# Patient Record
Sex: Male | Born: 1985 | Race: White | Hispanic: No | Marital: Married | Smoking: Former smoker
Health system: Southern US, Community
[De-identification: ages and names within clinical notes are randomized; demographics above are authoritative.]

## PROBLEM LIST (undated history)

## (undated) ENCOUNTER — Ambulatory Visit: Admission: EM | Payer: BC Managed Care – PPO

## (undated) DIAGNOSIS — K589 Irritable bowel syndrome without diarrhea: Secondary | ICD-10-CM

## (undated) DIAGNOSIS — F909 Attention-deficit hyperactivity disorder, unspecified type: Secondary | ICD-10-CM

## (undated) DIAGNOSIS — G8929 Other chronic pain: Secondary | ICD-10-CM

## (undated) HISTORY — PX: APPENDECTOMY: SHX54

---

## 2016-03-13 ENCOUNTER — Encounter: Payer: Self-pay | Admitting: *Deleted

## 2016-03-13 ENCOUNTER — Ambulatory Visit (HOSPITAL_COMMUNITY)
Admit: 2016-03-13 | Discharge: 2016-03-13 | Disposition: A | Discharge status: Home / Self Care | Payer: BLUE CROSS/BLUE SHIELD | Attending: Family Medicine | Admitting: Family Medicine

## 2016-03-13 ENCOUNTER — Ambulatory Visit
Admission: EM | Admit: 2016-03-13 | Discharge: 2016-03-13 | Disposition: A | Discharge status: Home / Self Care | Payer: BLUE CROSS/BLUE SHIELD | Attending: Family Medicine | Admitting: Family Medicine

## 2016-03-13 ENCOUNTER — Ambulatory Visit (INDEPENDENT_AMBULATORY_CARE_PROVIDER_SITE_OTHER)
Admit: 2016-03-13 | Discharge: 2016-03-13 | Disposition: A | Discharge status: Home / Self Care | Payer: BLUE CROSS/BLUE SHIELD | Attending: Family Medicine | Admitting: Family Medicine

## 2016-03-13 DIAGNOSIS — W19XXXA Unspecified fall, initial encounter: Secondary | ICD-10-CM | POA: Diagnosis not present

## 2016-03-13 DIAGNOSIS — R55 Syncope and collapse: Secondary | ICD-10-CM | POA: Diagnosis not present

## 2016-03-13 DIAGNOSIS — S0181XA Laceration without foreign body of other part of head, initial encounter: Secondary | ICD-10-CM | POA: Diagnosis not present

## 2016-03-13 DIAGNOSIS — R42 Dizziness and giddiness: Secondary | ICD-10-CM

## 2016-03-13 DIAGNOSIS — Z23 Encounter for immunization: Secondary | ICD-10-CM | POA: Diagnosis not present

## 2016-03-13 MED ORDER — AMOXICILLIN-POT CLAVULANATE 875-125 MG PO TABS: 1.0000 | ORAL_TABLET | Freq: Two times a day (BID) | ORAL | 0 refills | Status: DC

## 2016-03-13 MED ORDER — TETANUS-DIPHTH-ACELL PERTUSSIS 5-2.5-18.5 LF-MCG/0.5 IM SUSP
0.5000 mL | Freq: Once | INTRAMUSCULAR | Status: AC
  Administered 2016-03-13: 0.5 mL via INTRAMUSCULAR

## 2016-03-13 NOTE — ED Triage Notes (Signed)
Pt states he had received stressful news from work and after hanging up the phone went to have a bowel movement and became dizzy and fell face first striking jaw on unknown object. Has 1/2" through and through laceration just below lower lip and a superficial laceration to anterior scalp. States he remains dizzy presently.

## 2016-03-13 NOTE — ED Provider Notes (Signed)
[facility]    CSN: [id number] Arrival date & time: 03/13/16  1555  First Provider Contact:  First MD Initiated Contact with Patient 03/13/16 1653        History   Chief Complaint Chief Complaint  Patient presents with  . Near Syncope  . Dizziness  . Laceration    HPI Trevor Dickson is a 30 y.o. male.   Patient states he was receiving some his work that was somewhat challenging when he went upstairs to go to use a bathroom. He states he climbs steps to go to the bathroom started feeling lightheaded dizzy. By the time he got into the bathroom he was having trouble on fasting his pants the dizziness continued and got worse and he basically had a near-syncopal episode. He never lost consciousness they did fall and he fell he hit his head on one thing is generally another thing. He states he still doesn't feel that great right now. He had a syncopal episode about 12 years ago when he was menstruating friend about unrequited love and he went to have a bowel movement and passed out then. He has a history of IBS no history of seizure disorder.  Other than IBS no pertinent medical history. He has a history of smoking no known drug allergies and no pertinent family medical history pertaining to this visit   According to his wife he hit his head on one object he then also bit through his lower gum with his upper teeth and he has on puncture wound through the inner mouth extending to the outside skin   The history is provided by the patient and the spouse. No language interpreter was used.  Near Syncope  This is a new problem. The current episode started 1 to 2 hours ago. The problem has been gradually improving. Associated symptoms include headaches. Pertinent negatives include no chest pain, no abdominal pain and no shortness of breath. Nothing aggravates the symptoms. Nothing relieves the symptoms. He has tried nothing for the symptoms.  Dizziness  Associated symptoms: headaches     Associated symptoms: no chest pain and no shortness of breath   Laceration  Location:  Head/neck and mouth Head/neck laceration location:  Scalp Mouth laceration location:  Lower outer lip Depth:  Cutaneous Pain details:    Severity:  Moderate   Timing:  Constant   Progression:  Unchanged Foreign body present:  No foreign bodies Relieved by:  Nothing Associated symptoms: no fever, no focal weakness, no numbness, no rash, no redness, no swelling and no streaking     History reviewed. No pertinent past medical history.  There are no active problems to display for this patient.   Past Surgical History:  Procedure Laterality Date  . APPENDECTOMY         Home Medications    Prior to Admission medications   Medication Sig Start Date End Date Taking? Authorizing Provider  cetirizine (ZYRTEC) 10 MG tablet Take 10 mg by mouth daily.   Yes Historical Provider, MD  amoxicillin-clavulanate (AUGMENTIN) 875-125 MG tablet Take 1 tablet by mouth 2 (two) times daily. 03/13/16   Hassan Rowan, MD    Family History History reviewed. No pertinent family history.  Social History Social History  Substance Use Topics  . Smoking status: Current Every Day Smoker  . Smokeless tobacco: Never Used  . Alcohol use Yes     Allergies   Review of patient's allergies indicates no known allergies.   Review of Systems Review of  Systems  Constitutional: Negative for fever.  Respiratory: Negative for shortness of breath.   Cardiovascular: Positive for near-syncope. Negative for chest pain.  Gastrointestinal: Negative for abdominal pain.  Skin: Negative for rash.  Neurological: Positive for dizziness and headaches. Negative for focal weakness.  All other systems reviewed and are negative.    Physical Exam Triage Vital Signs ED Triage Vitals [03/13/16 1618]  Enc Vitals Group     BP 127/72     Pulse Rate 72     Resp 16     Temp 98 F (36.7 C)     Temp Source Oral     SpO2 97 %      Weight 165 lb (74.8 kg)     Height 5\' 7"  (1.702 m)     Head Circumference      Peak Flow      Pain Score      Pain Loc      Pain Edu?      Excl. in GC?    No data found.   Updated Vital Signs BP 127/72 (BP Location: Left Arm)   Pulse 72   Temp 98 F (36.7 C) (Oral)   Resp 16   Ht 5\' 7"  (1.702 m)   Wt 165 lb (74.8 kg)   SpO2 97%   BMI 25.84 kg/m   Visual Acuity Right Eye Distance:   Left Eye Distance:   Bilateral Distance:    Right Eye Near:   Left Eye Near:    Bilateral Near:     Physical Exam  Constitutional: He is oriented to person, place, and time. He appears well-developed and well-nourished.  HENT:  Head: Normocephalic. Head is with laceration.    Right Ear: External ear normal.  Left Ear: External ear normal.  Nose: Nose normal.  Mouth/Throat: Oropharynx is clear and moist.  She has 2 laceration a 2 semi-laceration on the upper scalp that goes across skin lines and then another laceration going through the dermis on his right lower lip almost midline  He has a puncture wound in the inner mouth the goes out into the dermis that caused the lower lip laceration  Eyes: Pupils are equal, round, and reactive to light.  Neck: Normal range of motion.  Cardiovascular: Normal rate and regular rhythm.   Pulmonary/Chest: Effort normal and breath sounds normal.  Abdominal: Soft.  Musculoskeletal: Normal range of motion. He exhibits no edema.  Neurological: He is alert and oriented to person, place, and time. He has normal reflexes.  Skin: Skin is warm.  Psychiatric: He has a normal mood and affect.  Vitals reviewed.    UC Treatments / Results  Labs (all labs ordered are listed, but only abnormal results are displayed) Labs Reviewed - No data to display  EKG  EKG Interpretation None     ED ECG REPORT I, Emran Molzahn H, the attending physician, personally viewed and interpreted this ECG.   Date: 03/13/2016  EKG Time 17:26:51   Rhythm: normal EKG,  normal sinus rhythm  Axis: 51  Intervals:none  ST&T Change: none  Radiology Ct Head Wo Contrast  Result Date: 03/13/2016 CLINICAL DATA:  Pain after trauma. EXAM: CT HEAD WITHOUT CONTRAST TECHNIQUE: Contiguous axial images were obtained from the base of the skull through the vertex without intravenous contrast. COMPARISON:  None. FINDINGS: Brain: No evidence of acute infarction, hemorrhage, extra-axial collection, ventriculomegaly, or mass effect. Vascular: No hyperdense vessel or unexpected calcification. Skull: Negative for fracture or focal lesion. Sinuses/Orbits: No acute  findings. Other: None. IMPRESSION: Normal Electronically Signed   By: Gerome Samavid  Williams III M.D   On: 03/13/2016 18:15    Procedures .Marland Kitchen.Laceration Repair Date/Time: 03/13/2016 5:46 PM Performed by: Hassan RowanWADE, Lakiah Dhingra Authorized by: Hassan RowanWADE, Marieme Mcmackin   Consent:    Consent obtained:  Verbal   Consent given by:  Parent   Risks discussed:  Infection Laceration details:    Location:  Scalp   Scalp location:  Frontal   Length (cm):  3 Repair type:    Repair type:  Simple Exploration:    Contaminated: no   Treatment:    Area cleansed with:  Shur-Clens   Visualized foreign bodies/material removed: no   Skin repair:    Repair method:  Tissue adhesive Approximation:    Approximation:  Close   Vermilion border: well-aligned   Post-procedure details:    Dressing:  Open (no dressing)   Patient tolerance of procedure:  Tolerated well, no immediate complications Comments:     Both areas removed patient tolerated going well.    (including critical care time)  Medications Ordered in UC Medications  Tdap (BOOSTRIX) injection 0.5 mL (0.5 mLs Intramuscular Given 03/13/16 1634)     Initial Impression / Assessment and Plan / UC Course  I have reviewed the triage vital signs and the nursing notes.  Pertinent labs & imaging results that were available during my care of the patient were reviewed by me and considered in my medical  decision making (see chart for details).  Clinical Course    Patient with no sequela episode head contusion and to lacerations. Examination was otherwise unremarkable other than the laceration. Using Dermabond visitors repair. Patient given a tetanus status out of date. EKG showed no signs of arrhythmia. And CT scan of the head was ordered and best normal will allow him to go home we'll place him on Augmentin since he has a wound to his outer lip involving his in her mouth  Final Clinical Impressions(s) / UC Diagnoses   Final diagnoses:  Near syncope  Facial laceration, initial encounter  Dizziness    New Prescriptions New Prescriptions   AMOXICILLIN-CLAVULANATE (AUGMENTIN) 875-125 MG TABLET    Take 1 tablet by mouth 2 (two) times daily.     Hassan RowanEugene Coren Sagan, MD 03/13/16 [phone]

## 2021-08-10 ENCOUNTER — Other Ambulatory Visit: Payer: Self-pay

## 2021-08-10 ENCOUNTER — Ambulatory Visit (INDEPENDENT_AMBULATORY_CARE_PROVIDER_SITE_OTHER): Payer: No Typology Code available for payment source

## 2021-08-10 ENCOUNTER — Ambulatory Visit
Admission: EM | Admit: 2021-08-10 | Discharge: 2021-08-10 | Disposition: A | Discharge status: Home / Self Care | Payer: No Typology Code available for payment source | Attending: Internal Medicine | Admitting: Internal Medicine

## 2021-08-10 DIAGNOSIS — R0602 Shortness of breath: Secondary | ICD-10-CM | POA: Diagnosis not present

## 2021-08-10 DIAGNOSIS — R059 Cough, unspecified: Secondary | ICD-10-CM | POA: Diagnosis not present

## 2021-08-10 DIAGNOSIS — J069 Acute upper respiratory infection, unspecified: Secondary | ICD-10-CM | POA: Diagnosis present

## 2021-08-10 DIAGNOSIS — Z20822 Contact with and (suspected) exposure to covid-19: Secondary | ICD-10-CM | POA: Insufficient documentation

## 2021-08-10 DIAGNOSIS — S29012A Strain of muscle and tendon of back wall of thorax, initial encounter: Secondary | ICD-10-CM | POA: Diagnosis present

## 2021-08-10 LAB — GROUP A STREP BY PCR: Group A Strep by PCR: NOT DETECTED

## 2021-08-10 MED ORDER — BENZONATATE 200 MG PO CAPS: 200.0000 mg | ORAL_CAPSULE | Freq: Three times a day (TID) | ORAL | 0 refills | Status: DC | PRN

## 2021-08-10 MED ORDER — METAXALONE 800 MG PO TABS: 800.0000 mg | ORAL_TABLET | Freq: Three times a day (TID) | ORAL | 0 refills | Status: DC

## 2021-08-10 MED ORDER — ACETAMINOPHEN 500 MG PO TABS
1000.0000 mg | ORAL_TABLET | Freq: Four times a day (QID) | ORAL | Status: DC | PRN
  Administered 2021-08-10: 1000 mg via ORAL

## 2021-08-10 NOTE — ED Triage Notes (Addendum)
Patient presents to Urgent Care with complaints of SOB, cough, rash, and back pain since Monday. Treating symptoms with mucinex, naproxen, and albuterol. Pt states he has a telehealth appt yesterday was prescribed cough med.   Denies fever.

## 2021-08-10 NOTE — ED Provider Notes (Signed)
[facility]    CSN: [id number] Arrival date & time: 08/10/21  0912      History   Chief Complaint Chief Complaint  Patient presents with   Shortness of Breath   Cough   Rash   Back Pain    HPI Trevor Dickson is a 36 y.o. male who presents with URI symptoms and low back pain and lower leg pain x 4 days. Has been fatigued, with decreased appetite. Has chronic back pain, but feels worse since illness and coughing, and cant take deep breaths since provokes thoracic back pain. Denies trouble breathing of feeling SOB, like he cant get air in his lung. Though his back hurts he has continued doing his PT exercises which help his back pain. Is out of Skelaxin which has helped in the past.     History reviewed. No pertinent past medical history.  There are no problems to display for this patient.   Past Surgical History:  Procedure Laterality Date   APPENDECTOMY         Home Medications    Prior to Admission medications   Medication Sig Start Date End Date Taking? Authorizing Provider  albuterol (VENTOLIN HFA) 108 (90 Base) MCG/ACT inhaler Inhale into the lungs. 12/12/19 02/27/22 Yes [provider]  benzonatate (TESSALON) 200 MG capsule Take 1 capsule (200 mg total) by mouth 3 (three) times daily as needed for cough. 08/10/21  Yes Rodriguez-Southworth, Nettie Elm, PA-C  metaxalone (SKELAXIN) 800 MG tablet Take 1 tablet (800 mg total) by mouth 3 (three) times daily. 08/10/21  Yes Rodriguez-Southworth, Nettie Elm, PA-C  Azelastine HCl 137 MCG/SPRAY SOLN Place 1 spray into both nostrils 2 (two) times daily. 06/15/21   [provider]  cetirizine (ZYRTEC) 10 MG tablet Take 10 mg by mouth daily.    [provider]  Multiple Vitamins-Minerals (GNP ONE DAILY MENS 50+ADVANCED) TABS Take 1 tablet by mouth daily.    [provider]    Family History History reviewed. No pertinent family history.  Social History Social History   Tobacco Use   Smoking  status: Every Day   Smokeless tobacco: Never  Substance Use Topics   Alcohol use: Yes     Allergies   Ibuprofen   Review of Systems Review of Systems  Constitutional:  Positive for appetite change, chills, diaphoresis and fatigue. Negative for activity change.  HENT:  Positive for congestion, postnasal drip and sore throat. Negative for ear discharge, ear pain and trouble swallowing.   Eyes:  Negative for discharge.  Respiratory:  Positive for cough. Negative for chest tightness, shortness of breath and wheezing.   Musculoskeletal:  Positive for myalgias. Negative for gait problem.  Skin:  Negative for rash.  Neurological:  Positive for headaches.  Hematological:  Negative for adenopathy.    Physical Exam Triage Vital Signs ED Triage Vitals  Enc Vitals Group     BP 08/10/21 0952 (!) 159/119     Pulse Rate 08/10/21 0952 86     Resp 08/10/21 0952 16     Temp 08/10/21 0952 (!) 97.5 F (36.4 C)     Temp Source 08/10/21 0952 Temporal     SpO2 08/10/21 0952 98 %     Weight --      Height --      Head Circumference --      Peak Flow --      Pain Score 08/10/21 0950 4     Pain Loc --      Pain Edu? --  Excl. in GC? --    No data found.  Updated Vital Signs BP (!) 159/119 (BP Location: Left Arm)    Pulse 86    Temp (!) 97.5 F (36.4 C) (Temporal)    Resp 16    SpO2 98%   Visual Acuity Right Eye Distance:   Left Eye Distance:   Bilateral Distance:    Right Eye Near:   Left Eye Near:    Bilateral Near:     Physical Exam Physical Exam Vitals signs and nursing note reviewed.  Constitutional:      General: he is not in acute distress.    Appearance: Normal appearance. He is not ill-appearing, toxic-appearing or diaphoretic.  HENT:     Head: Normocephalic.     Right Ear: Tympanic membrane, ear canal and external ear normal.     Left Ear: Tympanic membrane, ear canal and external ear normal.     Nose: Nose normal.     Mouth/Throat: mild erythema    Mouth:  Mucous membranes are moist.  Eyes:     General: No scleral icterus.       Right eye: No discharge.        Left eye: No discharge.     Conjunctiva/sclera: Conjunctivae normal.  Neck:     Musculoskeletal: Neck supple. No neck rigidity.  Cardiovascular:     Rate and Rhythm: Normal rate and regular rhythm.     Heart sounds: No murmur.  Pulmonary:     Effort: Pulmonary effort is normal.     Breath sounds: Normal breath sounds.  Musculoskeletal: Normal range of motion. Has tenderness on mid thoracic region and lower lumbar region with palpation, and his muscles feel tight. SLR is neg.  Lymphadenopathy:     Cervical: No cervical adenopathy.  Skin:    General: Skin is warm and dry.     Coloration: Skin is not jaundiced.     Findings: No rash.  Neurological:     Mental Status: He is alert and oriented to person, place, and time.     Gait: Gait normal. LE strength 5/5 bilaterally.  Psychiatric:        Mood and Affect: Mood normal.        Behavior: Behavior normal.        Thought Content: Thought content normal.        Judgment: Judgment normal.    UC Treatments / Results  Labs (all labs ordered are listed, but only abnormal results are displayed) Labs Reviewed  GROUP A STREP BY PCR  SARS CORONAVIRUS 2 (TAT 6-24 HRS)    EKG   Radiology DG Chest 2 View  Result Date: 08/10/2021 CLINICAL DATA:  Cough and shortness of breath. EXAM: CHEST - 2 VIEW COMPARISON:  None. FINDINGS: The lungs are clear without focal pneumonia, edema, pneumothorax or pleural effusion. The cardiopericardial silhouette is within normal limits for size. The visualized bony structures of the thorax show no acute abnormality. IMPRESSION: No active cardiopulmonary disease. Electronically Signed   By: Kennith Center M.D.   On: 08/10/2021 10:34    Procedures Procedures (including critical care time)  Medications Ordered in UC Medications  acetaminophen (TYLENOL) tablet 1,000 mg (has no administration in time range)     Initial Impression / Assessment and Plan / UC Course  I have reviewed the triage vital signs and the nursing notes. Pertinent labs & imaging results that were available during my care of the patient were reviewed by me and considered in my  medical decision making (see chart for details).  Final Clinical Impressions(s) / UC Diagnoses   Final diagnoses:  Suspected COVID-19 virus infection  Upper respiratory tract infection, unspecified type  Muscle strain of upper back   Discharge Instructions   None    ED Prescriptions     Medication Sig Dispense Auth. Provider   metaxalone (SKELAXIN) 800 MG tablet Take 1 tablet (800 mg total) by mouth 3 (three) times daily. 30 tablet Rodriguez-Southworth, Zakry Caso, PA-C   benzonatate (TESSALON) 200 MG capsule Take 1 capsule (200 mg total) by mouth 3 (three) times daily as needed for cough. 30 capsule Rodriguez-Southworth, Nettie ElmSylvia, PA-C      PDMP not reviewed this encounter.   Garey HamRodriguez-Southworth, Lismary Kiehn, PA-C 08/10/21 1113

## 2021-08-11 LAB — SARS CORONAVIRUS 2 (TAT 6-24 HRS): SARS Coronavirus 2: NEGATIVE

## 2021-10-14 ENCOUNTER — Other Ambulatory Visit: Payer: Self-pay

## 2021-10-14 ENCOUNTER — Ambulatory Visit
Admission: EM | Admit: 2021-10-14 | Discharge: 2021-10-14 | Disposition: A | Discharge status: Home / Self Care | Payer: No Typology Code available for payment source | Attending: Student | Admitting: Student

## 2021-10-14 DIAGNOSIS — J209 Acute bronchitis, unspecified: Secondary | ICD-10-CM | POA: Diagnosis not present

## 2021-10-14 DIAGNOSIS — Z87891 Personal history of nicotine dependence: Secondary | ICD-10-CM

## 2021-10-14 DIAGNOSIS — M62830 Muscle spasm of back: Secondary | ICD-10-CM | POA: Diagnosis not present

## 2021-10-14 MED ORDER — AMOXICILLIN-POT CLAVULANATE 875-125 MG PO TABS: 1.0000 | ORAL_TABLET | Freq: Two times a day (BID) | ORAL | 0 refills | Status: DC

## 2021-10-14 MED ORDER — TIZANIDINE HCL 4 MG PO TABS: 4.0000 mg | ORAL_TABLET | Freq: Four times a day (QID) | ORAL | 0 refills | Status: DC | PRN

## 2021-10-14 MED ORDER — PREDNISONE 10 MG (21) PO TBPK: ORAL_TABLET | Freq: Every day | ORAL | 0 refills | Status: DC

## 2021-10-14 MED ORDER — ALBUTEROL SULFATE HFA 108 (90 BASE) MCG/ACT IN AERS
1.0000 | INHALATION_SPRAY | Freq: Four times a day (QID) | RESPIRATORY_TRACT | 0 refills | Status: DC | PRN
Start: 2021-10-14 — End: 2022-06-20

## 2021-10-14 NOTE — ED Provider Notes (Addendum)
[facility]    CSN: [id number] Arrival date & time: 10/14/21  1031      History   Chief Complaint Chief Complaint  Patient presents with   Cough    HPI Trevor Dickson is a 36 y.o. male presenting with cough x6 days. History cigarette smoking but not current smoker.  Describes hacking cough productive of green and yellow sputum, some dyspnea on exertion.  Coughing fits make it hard to breathe.  Denies fever/chills, chest pain, dizziness, weakness.  He states that he is having some spasms on his back from all the coughing.  Tried the albuterol inhaler that he had on hand without relief.  Also has attempted [employer] without relief.  No formal diagnosis of pulmonary disease, but has been told he has seasonal asthma before.  HPI  History reviewed. No pertinent past medical history.  There are no problems to display for this patient.   Past Surgical History:  Procedure Laterality Date   APPENDECTOMY         Home Medications    Prior to Admission medications   Medication Sig Start Date End Date Taking? Authorizing Provider  albuterol (VENTOLIN HFA) 108 (90 Base) MCG/ACT inhaler Inhale 1-2 puffs into the lungs every 6 (six) hours as needed for wheezing or shortness of breath. 10/14/21  Yes Rhys Martini, PA-C  amoxicillin-clavulanate (AUGMENTIN) 875-125 MG tablet Take 1 tablet by mouth every 12 (twelve) hours. 10/14/21  Yes Rhys Martini, PA-C  Azelastine HCl 137 MCG/SPRAY SOLN Place 1 spray into both nostrils 2 (two) times daily. 06/15/21  Yes [provider]  benzonatate (TESSALON) 200 MG capsule Take 1 capsule (200 mg total) by mouth 3 (three) times daily as needed for cough. 08/10/21  Yes Rodriguez-Southworth, Nettie Elm, PA-C  cetirizine (ZYRTEC) 10 MG tablet Take 10 mg by mouth daily.   Yes [provider]  Multiple Vitamins-Minerals (GNP ONE DAILY MENS 50+ADVANCED) TABS Take 1 tablet by mouth daily.   Yes [provider]  predniSONE  (STERAPRED UNI-PAK 21 TAB) 10 MG (21) TBPK tablet Take by mouth daily. Take 6 tabs by mouth daily  for 2 days, then 5 tabs for 2 days, then 4 tabs for 2 days, then 3 tabs for 2 days, 2 tabs for 2 days, then 1 tab by mouth daily for 2 days 10/14/21  Yes Cheree Ditto, Lyman Speller, PA-C  tiZANidine (ZANAFLEX) 4 MG tablet Take 1 tablet (4 mg total) by mouth every 6 (six) hours as needed for muscle spasms. 10/14/21  Yes Rhys Martini, PA-C    Family History History reviewed. No pertinent family history.  Social History Social History   Tobacco Use   Smoking status: Every Day   Smokeless tobacco: Never  Substance Use Topics   Alcohol use: Yes     Allergies   Ibuprofen   Review of Systems Review of Systems  Constitutional:  Negative for appetite change, chills and fever.  HENT:  Positive for congestion. Negative for ear pain, rhinorrhea, sinus pressure, sinus pain and sore throat.   Eyes:  Negative for redness and visual disturbance.  Respiratory:  Positive for cough. Negative for chest tightness, shortness of breath and wheezing.   Cardiovascular:  Negative for chest pain and palpitations.  Gastrointestinal:  Negative for abdominal pain, constipation, diarrhea, nausea and vomiting.  Genitourinary:  Negative for dysuria, frequency and urgency.  Musculoskeletal:  Negative for myalgias.  Neurological:  Negative for dizziness, weakness and headaches.  Psychiatric/Behavioral:  Negative for confusion.  All other systems reviewed and are negative.   Physical Exam Triage Vital Signs ED Triage Vitals  Enc Vitals Group     BP 10/14/21 1109 (!) 135/93     Pulse Rate 10/14/21 1109 95     Resp 10/14/21 1109 18     Temp 10/14/21 1109 98.4 F (36.9 C)     Temp src --      SpO2 10/14/21 1109 100 %     Weight 10/14/21 1108 190 lb (86.2 kg)     Height 10/14/21 1108 5\' 7"  (1.702 m)     Head Circumference --      Peak Flow --      Pain Score --      Pain Loc --      Pain Edu? --      Excl. in GC?  --    No data found.  Updated Vital Signs BP (!) 135/93    Pulse 95    Temp 98.4 F (36.9 C)    Resp 18    Ht 5\' 7"  (1.702 m)    Wt 190 lb (86.2 kg)    SpO2 100%    BMI 29.76 kg/m   Visual Acuity Right Eye Distance:   Left Eye Distance:   Bilateral Distance:    Right Eye Near:   Left Eye Near:    Bilateral Near:     Physical Exam Vitals reviewed.  Constitutional:      General: He is not in acute distress.    Appearance: Normal appearance. He is not ill-appearing.  HENT:     Head: Normocephalic and atraumatic.     Right Ear: Tympanic membrane, ear canal and external ear normal. No tenderness. No middle ear effusion. There is no impacted cerumen. Tympanic membrane is not perforated, erythematous, retracted or bulging.     Left Ear: Tympanic membrane, ear canal and external ear normal. No tenderness.  No middle ear effusion. There is no impacted cerumen. Tympanic membrane is not perforated, erythematous, retracted or bulging.     Nose: Nose normal. No congestion.     Mouth/Throat:     Mouth: Mucous membranes are moist.     Pharynx: Uvula midline. No oropharyngeal exudate or posterior oropharyngeal erythema.  Eyes:     Extraocular Movements: Extraocular movements intact.     Pupils: Pupils are equal, round, and reactive to light.  Cardiovascular:     Rate and Rhythm: Normal rate and regular rhythm.     Heart sounds: Normal heart sounds.  Pulmonary:     Effort: Pulmonary effort is normal.     Breath sounds: Rhonchi present. No decreased breath sounds, wheezing or rales.     Comments: Rhonchi throughout  Abdominal:     Palpations: Abdomen is soft.     Tenderness: There is no abdominal tenderness. There is no guarding or rebound.  Musculoskeletal:     Comments: Right-sided thoracic paraspinous muscle tenderness and hypertonicity. No midline tenderness, deformity, stepoff.   Lymphadenopathy:     Cervical: No cervical adenopathy.     Right cervical: No superficial cervical  adenopathy.    Left cervical: No superficial cervical adenopathy.  Neurological:     General: No focal deficit present.     Mental Status: He is alert and oriented to person, place, and time.  Psychiatric:        Mood and Affect: Mood normal.        Behavior: Behavior normal.        Thought Content:  Thought content normal.        Judgment: Judgment normal.     UC Treatments / Results  Labs (all labs ordered are listed, but only abnormal results are displayed) Labs Reviewed - No data to display  EKG   Radiology No results found.  Procedures Procedures (including critical care time)  Medications Ordered in UC Medications - No data to display  Initial Impression / Assessment and Plan / UC Course  I have reviewed the triage vital signs and the nursing notes.  Pertinent labs & imaging results that were available during my care of the patient were reviewed by me and considered in my medical decision making (see chart for details).     This patient is a very pleasant 36 y.o. year old male presenting with cough following viral syndrome x6 days. Today this pt is afebrile nontachycardic nontachypneic, oxygenating well on room air. Rhonchi throughout without focal consolidation. Former smoker.   Did not send a covid PCR given duration of symptoms.  Declines CXR, I am in agreement as this would not change management.  Given smoking history will manage as COPD exacerbation with augmentin, prednisone, albuterol. Also sent zanaflex for thoracic strain.   ED return precautions discussed. Patient verbalizes understanding and agreement.   -Coding Level 4 for acute exacerbation of chronic condition and prescription drug management.   Final Clinical Impressions(s) / UC Diagnoses   Final diagnoses:  Acute bronchitis, unspecified organism  Former smoker  Spasm of thoracic back muscle     Discharge Instructions      -Albuterol inhaler as needed for cough, wheezing, shortness of  breath, 1 to 2 puffs every 6 hours as needed. -Prednisone taper for cough/bronchitis. I recommend taking this in the morning as it could give you energy.  Avoid NSAIDs like ibuprofen and alleve while taking this medication as they can increase your risk of stomach upset and even GI bleeding when in combination with a steroid. You can continue tylenol (acetaminophen) up to 1000mg  3x daily. -Start the antibiotic-Augmentin (amoxicillin-clavulanate), 1 pill every 12 hours for 7 days.  You can take this with food like with breakfast and dinner. -Start the muscle relaxer-Zanaflex (tizanidine), up to 3 times daily for muscle spasms and pain.  This can make you drowsy, so take at bedtime or when you do not need to drive or operate machinery. -With a virus, you're typically contagious for 5-7 days, or as long as you're having fevers.  -Follow-up if symptoms getting worse instead of better - shortness of breath, coughing up red or brown, chest pain at rest, new fevers, etc.      ED Prescriptions     Medication Sig Dispense Auth. Provider   tiZANidine (ZANAFLEX) 4 MG tablet Take 1 tablet (4 mg total) by mouth every 6 (six) hours as needed for muscle spasms. 30 tablet , PA-C   predniSONE (STERAPRED UNI-PAK 21 TAB) 10 MG (21) TBPK tablet Take by mouth daily. Take 6 tabs by mouth daily  for 2 days, then 5 tabs for 2 days, then 4 tabs for 2 days, then 3 tabs for 2 days, 2 tabs for 2 days, then 1 tab by mouth daily for 2 days 42 tablet Rhys Martini, PA-C   albuterol (VENTOLIN HFA) 108 (90 Base) MCG/ACT inhaler Inhale 1-2 puffs into the lungs every 6 (six) hours as needed for wheezing or shortness of breath. 1 each Rhys Martini, PA-C   amoxicillin-clavulanate (AUGMENTIN) 875-125 MG tablet Take 1 tablet  by mouth every 12 (twelve) hours. 14 tablet Rhys MartiniGraham, Doyle Kunath E, PA-C      PDMP not reviewed this encounter.   Rhys MartiniGraham, Willmar Stockinger E, PA-C 10/14/21 1217    Rhys MartiniGraham, Dainel Arcidiacono E, PA-C 10/14/21 1227

## 2021-10-14 NOTE — ED Triage Notes (Signed)
Pt here with C/O cough, states it is hard to breathe with coughing for 6 days. Gets into coughing fits.  ?

## 2021-10-14 NOTE — Discharge Instructions (Addendum)
-  Albuterol inhaler as needed for cough, wheezing, shortness of breath, 1 to 2 puffs every 6 hours as needed. ?-Prednisone taper for cough/bronchitis. I recommend taking this in the morning as it could give you energy.  Avoid NSAIDs like ibuprofen and alleve while taking this medication as they can increase your risk of stomach upset and even GI bleeding when in combination with a steroid. You can continue tylenol (acetaminophen) up to 1000mg  3x daily. ?-Start the antibiotic-Augmentin (amoxicillin-clavulanate), 1 pill every 12 hours for 7 days.  You can take this with food like with breakfast and dinner. ?-Start the muscle relaxer-Zanaflex (tizanidine), up to 3 times daily for muscle spasms and pain.  This can make you drowsy, so take at bedtime or when you do not need to drive or operate machinery. ?-With a virus, you're typically contagious for 5-7 days, or as long as you're having fevers.  ?-Follow-up if symptoms getting worse instead of better - shortness of breath, coughing up red or brown, chest pain at rest, new fevers, etc.  ?

## 2021-10-17 ENCOUNTER — Other Ambulatory Visit: Payer: Self-pay

## 2021-10-17 ENCOUNTER — Ambulatory Visit
Admission: EM | Admit: 2021-10-17 | Discharge: 2021-10-17 | Disposition: A | Discharge status: Home / Self Care | Payer: No Typology Code available for payment source | Attending: Physician Assistant | Admitting: Physician Assistant

## 2021-10-17 ENCOUNTER — Ambulatory Visit (INDEPENDENT_AMBULATORY_CARE_PROVIDER_SITE_OTHER): Payer: No Typology Code available for payment source

## 2021-10-17 DIAGNOSIS — R051 Acute cough: Secondary | ICD-10-CM | POA: Diagnosis not present

## 2021-10-17 DIAGNOSIS — R0602 Shortness of breath: Secondary | ICD-10-CM | POA: Diagnosis not present

## 2021-10-17 DIAGNOSIS — R059 Cough, unspecified: Secondary | ICD-10-CM

## 2021-10-17 DIAGNOSIS — B379 Candidiasis, unspecified: Secondary | ICD-10-CM | POA: Diagnosis not present

## 2021-10-17 DIAGNOSIS — J209 Acute bronchitis, unspecified: Secondary | ICD-10-CM

## 2021-10-17 MED ORDER — CHERATUSSIN AC 100-10 MG/5ML PO SOLN: 10.0000 mL | Freq: Three times a day (TID) | ORAL | 0 refills | Status: DC | PRN

## 2021-10-17 MED ORDER — FLUCONAZOLE 150 MG PO TABS: ORAL_TABLET | ORAL | 1 refills | Status: DC

## 2021-10-17 NOTE — Discharge Instructions (Signed)
-  Your chest x-ray is clear.  You do not have pneumonia.  As discussed you likely have viral bronchitis and often people can be sick with the symptoms for a couple of weeks but you should be improving over the next 1 week.  You can finish the antibiotics since she started it.  See how you are feeling on the prednisone.  If you do not like the side effects he can discontinue it but if you think it is helping and or if that you can finish it.  I have sent cough medicine to pharmacy.  You should increase your rest and fluid intake.  You should return if you develop a fever or increased breathing difficulty, otherwise expect to be sick another week or so. ?

## 2021-10-17 NOTE — ED Triage Notes (Signed)
Pt c/o cough, sore throat, lightheadedness x1week.  ? ?Pt is currently taking prednisone and states that it makes him "panicky". Pt states that the cough has gotten worse.  ? ?Pt went to work yesterday and talked all day as he works in [employer]. Pt drunk alcohol last night. ?

## 2021-10-17 NOTE — ED Provider Notes (Signed)
?[facility] ? ? ? ?CSN: [id number] ?Arrival date & time: 10/17/21  1409 ? ? ?  ? ?History   ?Chief Complaint ?Chief Complaint  ?Patient presents with  ? Cough  ? Sore Throat  ? ? ?HPI ?Trevor Dickson is a 36 y.o. male presenting for greater than 1 week history of cough that is productive of yellowish sputum, fatigue, body aches, chest tightness, diffuse back pain, and is feeling overall poorly.  Patient was seen in clinic 3 days ago for the symptoms.  He was believed to have bronchitis.  Patient does report that his entire family has been sick with a cough and similar symptoms.  He was prescribed Augmentin, prednisone, albuterol, tizanidine, and Tessalon Perles.  Reports the prednisone has really helped his symptoms but it has caused some side effects including difficulty sleeping and feeling panicky.  Also reports feeling dizzy and lightheaded since starting prednisone.  Patient reports he went to work yesterday because he was feeling much better.  He reports now feeling much worse today and feeling a bit short of breath.  Does not report any concern for COVID-19.  Patient is a smoker.  He has no history of asthma or lung disease. ? ?HPI ? ?History reviewed. No pertinent past medical history. ? ?There are no problems to display for this patient. ? ? ?Past Surgical History:  ?Procedure Laterality Date  ? APPENDECTOMY    ? ? ? ? ? ?Home Medications   ? ?Prior to Admission medications   ?Medication Sig Start Date End Date Taking? Authorizing Provider  ?albuterol (VENTOLIN HFA) 108 (90 Base) MCG/ACT inhaler Inhale 1-2 puffs into the lungs every 6 (six) hours as needed for wheezing or shortness of breath. 10/14/21  Yes Rhys MartiniGraham, Laura E, PA-C  ?Azelastine HCl 137 MCG/SPRAY SOLN Place 1 spray into both nostrils 2 (two) times daily. 06/15/21  Yes [provider]  ?benzonatate (TESSALON) 200 MG capsule Take 1 capsule (200 mg total) by mouth 3 (three) times daily as needed for cough. 08/10/21  Yes  Rodriguez-Southworth, Nettie ElmSylvia, PA-C  ?cetirizine (ZYRTEC) 10 MG tablet Take 10 mg by mouth daily.   Yes [provider]  ?fluconazole (DIFLUCAN) 150 MG tablet Take 1 tab PO q72h prn yeast infection 10/17/21  Yes Shirlee LatchEaves, Vella Colquitt B, PA-C  ?guaiFENesin-codeine (CHERATUSSIN AC) 100-10 MG/5ML syrup Take 10 mLs by mouth 3 (three) times daily as needed for cough. 10/17/21  Yes Shirlee LatchEaves, Tryce Surratt B, PA-C  ?Multiple Vitamins-Minerals (GNP ONE DAILY MENS 50+ADVANCED) TABS Take 1 tablet by mouth daily.   Yes [provider]  ?predniSONE (STERAPRED UNI-PAK 21 TAB) 10 MG (21) TBPK tablet Take by mouth daily. Take 6 tabs by mouth daily  for 2 days, then 5 tabs for 2 days, then 4 tabs for 2 days, then 3 tabs for 2 days, 2 tabs for 2 days, then 1 tab by mouth daily for 2 days 10/14/21  Yes Cheree DittoGraham, Lyman SpellerLaura E, PA-C  ?tiZANidine (ZANAFLEX) 4 MG tablet Take 1 tablet (4 mg total) by mouth every 6 (six) hours as needed for muscle spasms. 10/14/21  Yes Rhys MartiniGraham, Laura E, PA-C  ?amoxicillin-clavulanate (AUGMENTIN) 875-125 MG tablet Take 1 tablet by mouth every 12 (twelve) hours. 10/14/21   Rhys MartiniGraham, Laura E, PA-C  ? ? ?Family History ?History reviewed. No pertinent family history. ? ?Social History ?Social History  ? ?Tobacco Use  ? Smoking status: Every Day  ? Smokeless tobacco: Never  ?Vaping Use  ? Vaping Use: Every day  ?Substance Use Topics  ?  Alcohol use: Yes  ? Drug use: Never  ? ? ? ?Allergies   ?Ibuprofen ? ? ?Review of Systems ?Review of Systems  ?Constitutional:  Positive for fatigue. Negative for fever.  ?HENT:  Positive for congestion and rhinorrhea. Negative for sinus pressure, sinus pain and sore throat.   ?Respiratory:  Positive for cough and shortness of breath. Negative for wheezing.   ?Gastrointestinal:  Negative for abdominal pain, diarrhea, nausea and vomiting.  ?Musculoskeletal:  Positive for myalgias.  ?Neurological:  Positive for dizziness and headaches. Negative for weakness and light-headedness.  ?Hematological:   Negative for adenopathy.  ? ? ?Physical Exam ?Triage Vital Signs ?ED Triage Vitals  ?Enc Vitals Group  ?   BP 10/17/21 1419 123/88  ?   Pulse Rate 10/17/21 1419 (!) 102  ?   Resp 10/17/21 1419 18  ?   Temp 10/17/21 1419 97.9 ?F (36.6 ?C)  ?   Temp Source 10/17/21 1419 Oral  ?   SpO2 10/17/21 1419 97 %  ?   Weight 10/17/21 1417 190 lb (86.2 kg)  ?   Height 10/17/21 1417 5\' 7"  (1.702 m)  ?   Head Circumference --   ?   Peak Flow --   ?   Pain Score 10/17/21 1417 7  ?   Pain Loc --   ?   Pain Edu? --   ?   Excl. in GC? --   ? ?No data found. ? ?Updated Vital Signs ?BP 123/88 (BP Location: Left Arm)   Pulse (!) 102   Temp 97.9 ?F (36.6 ?C) (Oral)   Resp 18   Ht 5\' 7"  (1.702 m)   Wt 190 lb (86.2 kg)   SpO2 97%   BMI 29.76 kg/m?  ? ?   ? ?Physical Exam ?Vitals and nursing note reviewed.  ?Constitutional:   ?   General: He is not in acute distress. ?   Appearance: Normal appearance. He is well-developed. He is ill-appearing.  ?   Comments: +flushed  ?HENT:  ?   Head: Normocephalic and atraumatic.  ?   Right Ear: Tympanic membrane, ear canal and external ear normal.  ?   Left Ear: Tympanic membrane, ear canal and external ear normal.  ?   Nose: Congestion and rhinorrhea present.  ?   Mouth/Throat:  ?   Mouth: Mucous membranes are moist.  ?   Pharynx: Oropharynx is clear. Posterior oropharyngeal erythema present.  ?Eyes:  ?   General: No scleral icterus. ?   Conjunctiva/sclera: Conjunctivae normal.  ?Cardiovascular:  ?   Rate and Rhythm: Regular rhythm. Tachycardia present.  ?   Heart sounds: Normal heart sounds.  ?Pulmonary:  ?   Effort: Pulmonary effort is normal. No respiratory distress.  ?   Breath sounds: Normal breath sounds.  ?Musculoskeletal:  ?   Cervical back: Neck supple.  ?Skin: ?   General: Skin is warm and dry.  ?   Capillary Refill: Capillary refill takes less than 2 seconds.  ?Neurological:  ?   General: No focal deficit present.  ?   Mental Status: He is alert. Mental status is at baseline.  ?    Motor: No weakness.  ?   Coordination: Coordination normal.  ?   Gait: Gait normal.  ?Psychiatric:     ?   Mood and Affect: Mood normal.     ?   Behavior: Behavior normal.     ?   Thought Content: Thought content normal.  ? ? ? ?UC  Treatments / Results  ?Labs ?(all labs ordered are listed, but only abnormal results are displayed) ?Labs Reviewed - No data to display ? ?EKG ? ? ?Radiology ?DG Chest 2 View ? ?Result Date: 10/17/2021 ?CLINICAL DATA:  Cough for 1.5 weeks.  Shortness of breath. EXAM: CHEST - 2 VIEW COMPARISON:  Chest two views 08/10/2021 FINDINGS: Cardiac silhouette and mediastinal contours are within normal limits. The lungs are clear. No pleural effusion or pneumothorax. Minimal multilevel degenerative disc changes of the thoracic spine. IMPRESSION: No active cardiopulmonary disease. Electronically Signed   By: Neita Garnet M.D.   On: 10/17/2021 15:23   ? ?Procedures ?Procedures (including critical care time) ? ?Medications Ordered in UC ?Medications - No data to display ? ?Initial Impression / Assessment and Plan / UC Course  ?I have reviewed the triage vital signs and the nursing notes. ? ?Pertinent labs & imaging results that were available during my care of the patient were reviewed by me and considered in my medical decision making (see chart for details). ? ? 36 year old male presenting for continued cough and congestion over the course of about a week and a half.  Patient was seen 3 days ago for the symptoms.  He reports increased shortness of breath over the past couple days.  Patient also reports the prednisone has made him anxious, lightheaded and has problems sleeping. ? ?Vitals all stable today.  He is ill-appearing, flushed.  On exam he has mild nasal congestion.  Mild posterior pharyngeal erythema with clear postnasal drainage.  Chest is clear to auscultation and heart regular rhythm but mildly tachycardic at 102 bpm. ? ?Chest x-ray ordered to assess for possible underlying pneumonia given  that he is feeling worse.  Chest x-ray independently reviewed by me.  No evidence of pneumonia.  Questionable peribronchial thickening.  Advised patient of results.  Advised him his symptoms likely due to viral bronchitis.  C

## 2022-02-17 ENCOUNTER — Ambulatory Visit
Admission: EM | Admit: 2022-02-17 | Discharge: 2022-02-17 | Disposition: A | Discharge status: Home / Self Care | Payer: BC Managed Care – PPO

## 2022-02-17 DIAGNOSIS — S29019A Strain of muscle and tendon of unspecified wall of thorax, initial encounter: Secondary | ICD-10-CM | POA: Diagnosis not present

## 2022-02-17 MED ORDER — KETOROLAC TROMETHAMINE 10 MG PO TABS: 10.0000 mg | ORAL_TABLET | Freq: Four times a day (QID) | ORAL | 0 refills | Status: AC | PRN

## 2022-02-17 MED ORDER — KETOROLAC TROMETHAMINE 60 MG/2ML IM SOLN
60.0000 mg | Freq: Once | INTRAMUSCULAR | Status: AC
  Administered 2022-02-17: 60 mg via INTRAMUSCULAR

## 2022-02-17 NOTE — Discharge Instructions (Addendum)
Ice areas of pain for 20 minutes 3-4 times today and tomorrow, then after that alternate with heat. Take the full 800 mg of the Skelaxin every 8 hours  You may take Tylenol 1000 mg every 8 hours for added pain relief
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
## 2022-04-15 ENCOUNTER — Encounter: Payer: Self-pay | Admitting: Emergency Medicine

## 2022-04-15 ENCOUNTER — Ambulatory Visit
Admission: EM | Admit: 2022-04-15 | Discharge: 2022-04-15 | Disposition: A | Discharge status: Home / Self Care | Payer: BC Managed Care – PPO | Attending: Emergency Medicine | Admitting: Emergency Medicine

## 2022-04-15 DIAGNOSIS — M549 Dorsalgia, unspecified: Secondary | ICD-10-CM | POA: Diagnosis not present

## 2022-04-15 DIAGNOSIS — Z20822 Contact with and (suspected) exposure to covid-19: Secondary | ICD-10-CM | POA: Insufficient documentation

## 2022-04-15 DIAGNOSIS — K580 Irritable bowel syndrome with diarrhea: Secondary | ICD-10-CM | POA: Insufficient documentation

## 2022-04-15 DIAGNOSIS — M542 Cervicalgia: Secondary | ICD-10-CM | POA: Insufficient documentation

## 2022-04-15 DIAGNOSIS — J069 Acute upper respiratory infection, unspecified: Secondary | ICD-10-CM

## 2022-04-15 DIAGNOSIS — R059 Cough, unspecified: Secondary | ICD-10-CM | POA: Insufficient documentation

## 2022-04-15 LAB — SARS CORONAVIRUS 2 BY RT PCR: SARS Coronavirus 2 by RT PCR: NEGATIVE

## 2022-04-15 MED ORDER — PROMETHAZINE-DM 6.25-15 MG/5ML PO SYRP: 5.0000 mL | ORAL_SOLUTION | Freq: Four times a day (QID) | ORAL | 0 refills | Status: DC | PRN

## 2022-04-15 MED ORDER — IPRATROPIUM BROMIDE 0.06 % NA SOLN: 2.0000 | Freq: Four times a day (QID) | NASAL | 12 refills | Status: DC

## 2022-04-15 MED ORDER — BACLOFEN 10 MG PO TABS: 10.0000 mg | ORAL_TABLET | Freq: Three times a day (TID) | ORAL | 0 refills | Status: DC

## 2022-04-15 MED ORDER — BENZONATATE 100 MG PO CAPS: 200.0000 mg | ORAL_CAPSULE | Freq: Three times a day (TID) | ORAL | 0 refills | Status: DC

## 2022-04-15 NOTE — ED Triage Notes (Signed)
Patient c/o nasal congestion and cough that started 2 days ago.  Patient c/o neck stiffness and pain that started yesterday.  Patient denies fevers.

## 2022-04-15 NOTE — Discharge Instructions (Signed)
Your COVID test today was negative.  Your exam is consistent with a viral upper respiratory infection with a cough.  Use the Atrovent nasal spray, 2 squirts in each nostril every 6 hours, as needed for runny nose and postnasal drip.  Hold your azelastine nasal spray while taking this medication.  Use the Tessalon Perles every 8 hours during the day.  Take them with a small sip of water.  They may give you some numbness to the base of your tongue or a metallic taste in your mouth, this is normal.  Use the Promethazine DM cough syrup at bedtime for cough and congestion.  It will make you drowsy so do not take it during the day.  Take over-the-counter ibuprofen according to package instructions as needed for muscle pain in your neck and back.  Take the baclofen every 8 hours as needed for muscle tension in your neck and back.  Return for reevaluation or see your primary care provider for any new or worsening symptoms.

## 2022-04-15 NOTE — ED Provider Notes (Signed)
[facility]    CSN: [id number] Arrival date & time: 04/15/22  1348      History   Chief Complaint Chief Complaint  Patient presents with   Cough   Nasal Congestion    HPI Trevor Dickson is a 36 y.o. male.   HPI  36 year old male here for evaluation respiratory complaints.  Patient reports that his symptoms began 2 days ago and they consist of runny nose with clear nasal discharge, left ear pain, sore throat, and a nonproductive cough.  He denies any fever, shortness of breath, wheezing, GI complaints, or known sick contacts.  Patient does have diarrhea due to IBS but he states he has not seen any increases in diarrhea stools.  He is complaining of pain in his back and neck as a result of his intense coughing.  History reviewed. No pertinent past medical history.  There are no problems to display for this patient.   Past Surgical History:  Procedure Laterality Date   APPENDECTOMY         Home Medications    Prior to Admission medications   Medication Sig Start Date End Date Taking? Authorizing Provider  baclofen (LIORESAL) 10 MG tablet Take 1 tablet (10 mg total) by mouth 3 (three) times daily. 04/15/22  Yes Becky Augusta, NP  benzonatate (TESSALON) 100 MG capsule Take 2 capsules (200 mg total) by mouth every 8 (eight) hours. 04/15/22  Yes Becky Augusta, NP  ipratropium (ATROVENT) 0.06 % nasal spray Place 2 sprays into both nostrils 4 (four) times daily. 04/15/22  Yes Becky Augusta, NP  promethazine-dextromethorphan (PROMETHAZINE-DM) 6.25-15 MG/5ML syrup Take 5 mLs by mouth 4 (four) times daily as needed. 04/15/22  Yes Becky Augusta, NP  albuterol (VENTOLIN HFA) 108 (90 Base) MCG/ACT inhaler Inhale 1-2 puffs into the lungs every 6 (six) hours as needed for wheezing or shortness of breath. 10/14/21   Rhys Martini, PA-C  Azelastine HCl 137 MCG/SPRAY SOLN Place 1 spray into both nostrils 2 (two) times daily. 06/15/21   [provider]  cetirizine (ZYRTEC) 10  MG tablet Take 10 mg by mouth daily.    [provider]  ketorolac (TORADOL) 10 MG tablet Take 1 tablet (10 mg total) by mouth every 6 (six) hours as needed. 02/17/22   Rodriguez-Southworth, Nettie Elm, PA-C  Multiple Vitamins-Minerals (GNP ONE DAILY MENS 50+ADVANCED) TABS Take 1 tablet by mouth daily.    [provider]  traZODone (DESYREL) 50 MG tablet Take 50 mg by mouth at bedtime. 01/26/22   [provider]    Family History History reviewed. No pertinent family history.  Social History Social History   Tobacco Use   Smoking status: Never   Smokeless tobacco: Never  Vaping Use   Vaping Use: Every day  Substance Use Topics   Alcohol use: Yes   Drug use: Never     Allergies   Ibuprofen   Review of Systems Review of Systems  Constitutional:  Negative for fever.  HENT:  Positive for congestion, ear pain, rhinorrhea and sore throat.   Respiratory:  Positive for cough. Negative for shortness of breath and wheezing.   Gastrointestinal:  Negative for diarrhea, nausea and vomiting.  Musculoskeletal:  Positive for back pain, myalgias and neck pain.  Hematological: Negative.   Psychiatric/Behavioral: Negative.       Physical Exam Triage Vital Signs ED Triage Vitals  Enc Vitals Group     BP 04/15/22 1424 134/85     Pulse Rate 04/15/22 1424 81  Resp 04/15/22 1424 15     Temp 04/15/22 1424 98.4 F (36.9 C)     Temp Source 04/15/22 1424 Oral     SpO2 04/15/22 1424 97 %     Weight 04/15/22 1421 192 lb (87.1 kg)     Height 04/15/22 1421 5\' 6"  (1.676 m)     Head Circumference --      Peak Flow --      Pain Score 04/15/22 1421 7     Pain Loc --      Pain Edu? --      Excl. in GC? --    No data found.  Updated Vital Signs BP 134/85 (BP Location: Left Arm)   Pulse 81   Temp 98.4 F (36.9 C) (Oral)   Resp 15   Ht 5\' 6"  (1.676 m)   Wt 192 lb (87.1 kg)   SpO2 97%   BMI 30.99 kg/m   Visual Acuity Right Eye Distance:   Left Eye Distance:    Bilateral Distance:    Right Eye Near:   Left Eye Near:    Bilateral Near:     Physical Exam Vitals and nursing note reviewed.  Constitutional:      Appearance: Normal appearance. He is not ill-appearing.  HENT:     Head: Normocephalic and atraumatic.     Right Ear: Ear canal and external ear normal. There is no impacted cerumen.     Left Ear: Ear canal and external ear normal. There is no impacted cerumen.     Nose: Congestion and rhinorrhea present.     Mouth/Throat:     Mouth: Mucous membranes are moist.     Pharynx: Posterior oropharyngeal erythema present. No oropharyngeal exudate.  Cardiovascular:     Rate and Rhythm: Normal rate and regular rhythm.     Pulses: Normal pulses.     Heart sounds: Normal heart sounds. No murmur heard.    No friction rub. No gallop.  Pulmonary:     Effort: Pulmonary effort is normal.     Breath sounds: Normal breath sounds. No wheezing, rhonchi or rales.  Musculoskeletal:        General: Tenderness present. No swelling or deformity. Normal range of motion.     Cervical back: Normal range of motion and neck supple.  Lymphadenopathy:     Cervical: No cervical adenopathy.  Skin:    General: Skin is warm and dry.     Capillary Refill: Capillary refill takes less than 2 seconds.  Neurological:     General: No focal deficit present.     Mental Status: He is alert and oriented to person, place, and time.  Psychiatric:        Mood and Affect: Mood normal.        Behavior: Behavior normal.        Thought Content: Thought content normal.        Judgment: Judgment normal.      UC Treatments / Results  Labs (all labs ordered are listed, but only abnormal results are displayed) Labs Reviewed  SARS CORONAVIRUS 2 BY RT PCR    EKG   Radiology No results found.  Procedures Procedures (including critical care time)  Medications Ordered in UC Medications - No data to display  Initial Impression / Assessment and Plan / UC Course  I  have reviewed the triage vital signs and the nursing notes.  Pertinent labs & imaging results that were available during my care of the patient were  reviewed by me and considered in my medical decision making (see chart for details).   Patient is a nontoxic-appearing 36 year old male here for evaluation of respiratory complaints as outlined in HPI above.  His physical exam reveals mildly erythematous tympanic membranes bilaterally.  There is no injection or effusion noted.  Both external auditory canals are clear.  Nasal mucosa is erythematous and edematous with clear discharge in both nares.  Oropharyngeal exam reveals posterior oropharyngeal erythema and injection with clear postnasal drip.  No anterior cervical lymphadenopathy appreciated exam.  Cardiopulmonary exam reveals S1-S2 heart sounds with regular rate and rhythm and lung sounds are clear auscultation all fields.  Patient has some tension in his trapezius muscles bilaterally into the cervical paraspinous and thoracic paraspinous region.  No spasm noted.  I suspect that he has some muscle strain as a result of his intense coughing.  I will give him baclofen to take at home for relief of the muscle tension.  I will order a COVID PCR.  COVID PCR is negative.  I will discharge patient home with a diagnosis of viral URI with cough and treat him with Atrovent nasal spray, Tessalon Perles, and Promethazine DM cough syrup.  Also prescribed baclofen the patient can use as needed for muscle tension in his neck and back.  Final Clinical Impressions(s) / UC Diagnoses   Final diagnoses:  Viral URI with cough     Discharge Instructions      Your COVID test today was negative.  Your exam is consistent with a viral upper respiratory infection with a cough.  Use the Atrovent nasal spray, 2 squirts in each nostril every 6 hours, as needed for runny nose and postnasal drip.  Hold your azelastine nasal spray while taking this medication.  Use the  Tessalon Perles every 8 hours during the day.  Take them with a small sip of water.  They may give you some numbness to the base of your tongue or a metallic taste in your mouth, this is normal.  Use the Promethazine DM cough syrup at bedtime for cough and congestion.  It will make you drowsy so do not take it during the day.  Take over-the-counter ibuprofen according to package instructions as needed for muscle pain in your neck and back.  Take the baclofen every 8 hours as needed for muscle tension in your neck and back.  Return for reevaluation or see your primary care provider for any new or worsening symptoms.      ED Prescriptions     Medication Sig Dispense Auth. Provider   benzonatate (TESSALON) 100 MG capsule Take 2 capsules (200 mg total) by mouth every 8 (eight) hours. 21 capsule Becky Augusta, NP   ipratropium (ATROVENT) 0.06 % nasal spray Place 2 sprays into both nostrils 4 (four) times daily. 15 mL Becky Augusta, NP   promethazine-dextromethorphan (PROMETHAZINE-DM) 6.25-15 MG/5ML syrup Take 5 mLs by mouth 4 (four) times daily as needed. 118 mL Becky Augusta, NP   baclofen (LIORESAL) 10 MG tablet Take 1 tablet (10 mg total) by mouth 3 (three) times daily. 30 each Becky Augusta, NP      PDMP not reviewed this encounter.   Becky Augusta, NP 04/15/22 1520

## 2022-04-17 ENCOUNTER — Ambulatory Visit
Admission: EM | Admit: 2022-04-17 | Discharge: 2022-04-17 | Disposition: A | Discharge status: Home / Self Care | Payer: BC Managed Care – PPO | Attending: Physician Assistant | Admitting: Physician Assistant

## 2022-04-17 DIAGNOSIS — J029 Acute pharyngitis, unspecified: Secondary | ICD-10-CM | POA: Insufficient documentation

## 2022-04-17 DIAGNOSIS — J069 Acute upper respiratory infection, unspecified: Secondary | ICD-10-CM | POA: Insufficient documentation

## 2022-04-17 DIAGNOSIS — R0981 Nasal congestion: Secondary | ICD-10-CM | POA: Diagnosis present

## 2022-04-17 DIAGNOSIS — R051 Acute cough: Secondary | ICD-10-CM | POA: Insufficient documentation

## 2022-04-17 LAB — GROUP A STREP BY PCR: Group A Strep by PCR: NOT DETECTED

## 2022-04-17 NOTE — Discharge Instructions (Signed)
-  We will call with the strep test results.  If they are negative, you have a virus.  Most people feel better within 7 to 10 days.  Continue the medications that you are on and increase your rest and fluid intake.  -Return if you develop a fever or increased shortness of breath  URI/COLD SYMPTOMS: Your exam today is consistent with a viral illness. Antibiotics are not indicated at this time. Use medications as directed, including cough syrup, nasal saline, and decongestants. Your symptoms should improve over the next few days and resolve within 7-10 days. Increase rest and fluids. F/u if symptoms worsen or predominate such as sore throat, ear pain, productive cough, shortness of breath, or if you develop high fevers or worsening fatigue over the next several days.

## 2022-04-17 NOTE — ED Triage Notes (Signed)
Patient presents to [facility] for chest congestion. Was seen 09/10 for continued cough and nasal congestion. States he was prescribed several cough medications with some relief. Some SOB.

## 2022-04-17 NOTE — ED Provider Notes (Signed)
[facility]    CSN: [id number] Arrival date & time: 04/17/22  1100      History   Chief Complaint Chief Complaint  Patient presents with   Nasal Congestion   Cough    HPI Trevor Dickson is a 36 y.o. male presenting for 4-day history of cough, congestion, fatigue and sore throat.  Patient was seen 2 days ago in our clinic and had a negative COVID test.  He was prescribed multiple different cough medications which she says have helped but he feels some chest heaviness and shortness of breath and would like to have his lungs listened to again today.  He says his throat mostly hurts when he coughs but he is open to strep testing.  His child is here today with similar symptoms.  He denies a history of lung disease.  He vapes every day.  History of bronchitis per chart review.  No other complaints.  HPI  History reviewed. No pertinent past medical history.  There are no problems to display for this patient.   Past Surgical History:  Procedure Laterality Date   APPENDECTOMY         Home Medications    Prior to Admission medications   Medication Sig Start Date End Date Taking? Authorizing Provider  albuterol (VENTOLIN HFA) 108 (90 Base) MCG/ACT inhaler Inhale 1-2 puffs into the lungs every 6 (six) hours as needed for wheezing or shortness of breath. 10/14/21   Rhys Martini, PA-C  Azelastine HCl 137 MCG/SPRAY SOLN Place 1 spray into both nostrils 2 (two) times daily. 06/15/21   [provider]  baclofen (LIORESAL) 10 MG tablet Take 1 tablet (10 mg total) by mouth 3 (three) times daily. 04/15/22   Becky Augusta, NP  benzonatate (TESSALON) 100 MG capsule Take 2 capsules (200 mg total) by mouth every 8 (eight) hours. 04/15/22   Becky Augusta, NP  cetirizine (ZYRTEC) 10 MG tablet Take 10 mg by mouth daily.    [provider]  ipratropium (ATROVENT) 0.06 % nasal spray Place 2 sprays into both nostrils 4 (four) times daily. 04/15/22   Becky Augusta, NP  ketorolac  (TORADOL) 10 MG tablet Take 1 tablet (10 mg total) by mouth every 6 (six) hours as needed. 02/17/22   Rodriguez-Southworth, Nettie Elm, PA-C  Multiple Vitamins-Minerals (GNP ONE DAILY MENS 50+ADVANCED) TABS Take 1 tablet by mouth daily.    [provider]  promethazine-dextromethorphan (PROMETHAZINE-DM) 6.25-15 MG/5ML syrup Take 5 mLs by mouth 4 (four) times daily as needed. 04/15/22   Becky Augusta, NP  traZODone (DESYREL) 50 MG tablet Take 50 mg by mouth at bedtime. 01/26/22   [provider]    Family History History reviewed. No pertinent family history.  Social History Social History   Tobacco Use   Smoking status: Never   Smokeless tobacco: Never  Vaping Use   Vaping Use: Every day  Substance Use Topics   Alcohol use: Yes   Drug use: Never     Allergies   Ibuprofen   Review of Systems Review of Systems  Constitutional:  Positive for fatigue. Negative for fever.  HENT:  Positive for congestion, rhinorrhea and sore throat. Negative for ear pain and sinus pain.   Respiratory:  Positive for cough, chest tightness and shortness of breath.   Cardiovascular:  Negative for chest pain.  Gastrointestinal:  Negative for abdominal pain, diarrhea, nausea and vomiting.  Musculoskeletal:  Negative for myalgias.  Neurological:  Negative for dizziness, weakness and headaches.  Physical Exam Triage Vital Signs ED Triage Vitals  Enc Vitals Group     BP      Pulse      Resp      Temp      Temp src      SpO2      Weight      Height      Head Circumference      Peak Flow      Pain Score      Pain Loc      Pain Edu?      Excl. in GC?    No data found.  Updated Vital Signs BP (!) 138/103 (BP Location: Left Arm)   Pulse 73   Temp 98 F (36.7 C) (Temporal)   Resp 16   SpO2 99%      Physical Exam Vitals and nursing note reviewed.  Constitutional:      General: He is not in acute distress.    Appearance: Normal appearance. He is well-developed. He is  ill-appearing.  HENT:     Head: Normocephalic and atraumatic.     Nose: Congestion present.     Mouth/Throat:     Mouth: Mucous membranes are moist.     Pharynx: Oropharynx is clear. Posterior oropharyngeal erythema present.  Eyes:     General: No scleral icterus.    Conjunctiva/sclera: Conjunctivae normal.  Cardiovascular:     Rate and Rhythm: Normal rate and regular rhythm.     Heart sounds: Normal heart sounds.  Pulmonary:     Effort: Pulmonary effort is normal. No respiratory distress.     Breath sounds: Normal breath sounds. No wheezing, rhonchi or rales.  Musculoskeletal:     Cervical back: Neck supple.  Skin:    General: Skin is warm and dry.     Capillary Refill: Capillary refill takes less than 2 seconds.  Neurological:     General: No focal deficit present.     Mental Status: He is alert. Mental status is at baseline.     Motor: No weakness.     Gait: Gait normal.  Psychiatric:        Mood and Affect: Mood normal.        Behavior: Behavior normal.      UC Treatments / Results  Labs (all labs ordered are listed, but only abnormal results are displayed) Labs Reviewed  GROUP A STREP BY PCR    EKG   Radiology No results found.  Procedures Procedures (including critical care time)  Medications Ordered in UC Medications - No data to display  Initial Impression / Assessment and Plan / UC Course  I have reviewed the triage vital signs and the nursing notes.  Pertinent labs & imaging results that were available during my care of the patient were reviewed by me and considered in my medical decision making (see chart for details).   37 male presenting for 4-day history of cough, congestion, sore throat, fatigue, chest heaviness and shortness of breath.  Patient seen 2 days ago in our clinic and had a negative COVID test.  Prescribed Promethazine DM and benzonatate.  Reports the medications of help.  BP elevated at 138/103.  He is afebrile.  Oxygen 99% and he  is in no distress.  Appears mildly ill and fatigued but nontoxic.  On exam has nasal congestion and mild erythema posterior pharynx.  Chest clear to auscultation in all lung fields.  PCR strep test obtained.  Declines to be retested  for COVID or tested for flu at this time.  Negative strep.  Result communicated to patient.  Patient's symptoms still consistent with viral illness.  Advised him to continue the medications that were provided for him at the last visit.  Advised him to return if he develops a fever, worsening cough or increased shortness of breath.   Final Clinical Impressions(s) / UC Diagnoses   Final diagnoses:  Viral upper respiratory tract infection  Acute cough  Nasal congestion  Sore throat     Discharge Instructions      -We will call with the strep test results.  If they are negative, you have a virus.  Most people feel better within 7 to 10 days.  Continue the medications that you are on and increase your rest and fluid intake.  -Return if you develop a fever or increased shortness of breath  URI/COLD SYMPTOMS: Your exam today is consistent with a viral illness. Antibiotics are not indicated at this time. Use medications as directed, including cough syrup, nasal saline, and decongestants. Your symptoms should improve over the next few days and resolve within 7-10 days. Increase rest and fluids. F/u if symptoms worsen or predominate such as sore throat, ear pain, productive cough, shortness of breath, or if you develop high fevers or worsening fatigue over the next several days.       ED Prescriptions   None    PDMP not reviewed this encounter.   Shirlee Latch, PA-C 04/17/22 1346

## 2022-05-13 ENCOUNTER — Ambulatory Visit (INDEPENDENT_AMBULATORY_CARE_PROVIDER_SITE_OTHER): Payer: BC Managed Care – PPO

## 2022-05-13 ENCOUNTER — Ambulatory Visit
Admission: EM | Admit: 2022-05-13 | Discharge: 2022-05-13 | Disposition: A | Discharge status: Home / Self Care | Payer: BC Managed Care – PPO | Attending: Emergency Medicine | Admitting: Emergency Medicine

## 2022-05-13 ENCOUNTER — Encounter: Payer: Self-pay | Admitting: Emergency Medicine

## 2022-05-13 DIAGNOSIS — M79672 Pain in left foot: Secondary | ICD-10-CM | POA: Diagnosis not present

## 2022-05-13 DIAGNOSIS — M7750 Other enthesopathy of unspecified foot: Secondary | ICD-10-CM

## 2022-05-13 MED ORDER — PREDNISONE 10 MG (21) PO TBPK: ORAL_TABLET | ORAL | 0 refills | Status: DC

## 2022-05-13 MED ORDER — DEXAMETHASONE SODIUM PHOSPHATE 10 MG/ML IJ SOLN
10.0000 mg | Freq: Once | INTRAMUSCULAR | Status: AC
  Administered 2022-05-13: 10 mg via INTRAMUSCULAR

## 2022-05-13 NOTE — ED Provider Notes (Signed)
[facility]    CSN: [id number] Arrival date & time: 05/13/22  1155      History   Chief Complaint Chief Complaint  Patient presents with   Foot Pain    left    HPI Trevor Dickson is a 36 y.o. male.   HPI  36 year old male here for evaluation of left foot pain.  Patient reports that his pain began 4 days ago and it is mostly on the lateral aspect of his left foot but also both sides of his ankle and his heel.  He denies any numbness or tingling in his toes, noticeable swelling, redness, or bruising.  History reviewed. No pertinent past medical history.  There are no problems to display for this patient.   Past Surgical History:  Procedure Laterality Date   APPENDECTOMY         Home Medications    Prior to Admission medications   Medication Sig Start Date End Date Taking? Authorizing Provider  cetirizine (ZYRTEC) 10 MG tablet Take 10 mg by mouth daily.   Yes [provider]  Multiple Vitamins-Minerals (GNP ONE DAILY MENS 50+ADVANCED) TABS Take 1 tablet by mouth daily.   Yes [provider]  predniSONE (STERAPRED UNI-PAK 21 TAB) 10 MG (21) TBPK tablet Take 6 tablets on day 1, 5 tablets day 2, 4 tablets day 3, 3 tablets day 4, 2 tablets day 5, 1 tablet day 6 05/13/22  Yes Becky Augusta, NP  traZODone (DESYREL) 50 MG tablet Take 50 mg by mouth at bedtime. 01/26/22  Yes [provider]  albuterol (VENTOLIN HFA) 108 (90 Base) MCG/ACT inhaler Inhale 1-2 puffs into the lungs every 6 (six) hours as needed for wheezing or shortness of breath. 10/14/21   Rhys Martini, PA-C  Azelastine HCl 137 MCG/SPRAY SOLN Place 1 spray into both nostrils 2 (two) times daily. 06/15/21   [provider]  baclofen (LIORESAL) 10 MG tablet Take 1 tablet (10 mg total) by mouth 3 (three) times daily. 04/15/22   Becky Augusta, NP  benzonatate (TESSALON) 100 MG capsule Take 2 capsules (200 mg total) by mouth every 8 (eight) hours. 04/15/22   Becky Augusta, NP   ipratropium (ATROVENT) 0.06 % nasal spray Place 2 sprays into both nostrils 4 (four) times daily. 04/15/22   Becky Augusta, NP  ketorolac (TORADOL) 10 MG tablet Take 1 tablet (10 mg total) by mouth every 6 (six) hours as needed. 02/17/22   Rodriguez-Southworth, Nettie Elm, PA-C  promethazine-dextromethorphan (PROMETHAZINE-DM) 6.25-15 MG/5ML syrup Take 5 mLs by mouth 4 (four) times daily as needed. 04/15/22   Becky Augusta, NP    Family History History reviewed. No pertinent family history.  Social History Social History   Tobacco Use   Smoking status: Never   Smokeless tobacco: Never  Vaping Use   Vaping Use: Every day  Substance Use Topics   Alcohol use: Yes   Drug use: Never     Allergies   Ibuprofen   Review of Systems Review of Systems  Musculoskeletal:  Positive for arthralgias and myalgias. Negative for joint swelling.  Skin:  Negative for color change.  Neurological:  Negative for weakness and numbness.  Hematological: Negative.   Psychiatric/Behavioral: Negative.       Physical Exam Triage Vital Signs ED Triage Vitals  Enc Vitals Group     BP      Pulse      Resp      Temp      Temp src  SpO2      Weight      Height      Head Circumference      Peak Flow      Pain Score      Pain Loc      Pain Edu?      Excl. in GC?    No data found.  Updated Vital Signs BP 127/87 (BP Location: Right Arm)   Pulse 73   Temp 98.4 F (36.9 C) (Oral)   Resp 15   Ht 5\' 6"  (1.676 m)   Wt 192 lb 0.3 oz (87.1 kg)   SpO2 97%   BMI 30.99 kg/m   Visual Acuity Right Eye Distance:   Left Eye Distance:   Bilateral Distance:    Right Eye Near:   Left Eye Near:    Bilateral Near:     Physical Exam Vitals and nursing note reviewed.  Musculoskeletal:        General: Swelling and tenderness present. No deformity or signs of injury. Normal range of motion.  Skin:    General: Skin is warm and dry.     Capillary Refill: Capillary refill takes less than 2 seconds.      Findings: No bruising or erythema.  Neurological:     General: No focal deficit present.     Mental Status: He is oriented to person, place, and time.  Psychiatric:        Mood and Affect: Mood normal.        Behavior: Behavior normal.        Thought Content: Thought content normal.        Judgment: Judgment normal.      UC Treatments / Results  Labs (all labs ordered are listed, but only abnormal results are displayed) Labs Reviewed - No data to display  EKG   Radiology DG Foot Complete Left  Result Date: 05/13/2022 CLINICAL DATA:  Generalized foot pain for 4 days EXAM: LEFT FOOT - COMPLETE 3 VIEW COMPARISON:  None Available. FINDINGS: No acute fracture or dislocation. The joint spaces are well preserved. No soft tissue abnormalities. IMPRESSION: No acute fracture or dislocation. Electronically Signed   By: 07/13/2022 M.D.   On: 05/13/2022 13:35    Procedures Procedures (including critical care time)  Medications Ordered in UC Medications  dexamethasone (DECADRON) injection 10 mg (has no administration in time range)    Initial Impression / Assessment and Plan / UC Course  I have reviewed the triage vital signs and the nursing notes.  Pertinent labs & imaging results that were available during my care of the patient were reviewed by me and considered in my medical decision making (see chart for details).   Patient is a pleasant, nontoxic-appearing 36 year old male here for evaluation of 4 days worth of pain and swelling in his left foot that is most prominent along the lateral edge.  He states that the day before his pain began he was cleaning around the house and wearing flip-flops, which she does not usually do, in preparation for his fiance's surgery.  The following day he spent all day at the hospital and did a lot of pacing.  He is reporting pain along the lateral aspect of his foot, and both sides of the ankle (worse on the outside and inside), and in the heel.   On exam patient's foot is in normal anatomical alignment and there is no erythema or ecchymosis noted.  Patient has full sensation in his toes and  full range of motion as well.  He has full range of motion of his foot and ankle as well.  He has some mild tenderness with palpation of the soft tissue under the medial and lateral malleolus as well as the proximal lateral midfoot where there is some mild swelling noted.  No pain with palpation of the lateral edge of his foot or over the base of the fifth metatarsal.  He does have some mild tenderness with palpation of the calcaneus.  Patient also reports that he has tenderness when I asked him to wiggle his toes and he curled them under.  I suspect that patient has soft tissue inflammation that is causing his pain and may be some mild tendinitis as a result of wearing shoes he is not used to wearing for long periods of time.  I will obtain radiograph to look for any bony abnormality.  Radiology impression of left foot films states that there is no fracture or dislocation and the joint spaces are well-preserved.  No soft tissue abnormalities.  I will discharge patient home with a diagnosis of tendinitis of his left foot and treat him with a prednisone pack, rest, and elevation.   Final Clinical Impressions(s) / UC Diagnoses   Final diagnoses:  Tendinitis of foot     Discharge Instructions      Your x-rays today did not show any evidence of broken or dislocated bones.  Given that you have pain with flexion of your toes and also with palpation of the soft tissue I suspect that you have inflamed some of the tendons in your foot which is what is causing your pain.  Rest your foot is much as possible and also keep it elevated to decrease any swelling or pain.  You can soak your foot in warm water and Epsom salts as the heat will help improve circulation and resolve inflammation.  Take the prednisone according to package instructions starting tomorrow  morning at breakfast.  This will decrease inflammation in your tendons as well.  If your symptoms do not improve please return for reevaluation or see a podiatrist.     ED Prescriptions     Medication Sig Dispense Auth. Provider   predniSONE (STERAPRED UNI-PAK 21 TAB) 10 MG (21) TBPK tablet Take 6 tablets on day 1, 5 tablets day 2, 4 tablets day 3, 3 tablets day 4, 2 tablets day 5, 1 tablet day 6 21 tablet Margarette Canada, NP      PDMP not reviewed this encounter.   Margarette Canada, NP 05/13/22 1343

## 2022-05-13 NOTE — ED Triage Notes (Signed)
Patient c/o pain in his heel and on the outside of his left foot since Wed.  Patient denies injury or fall.

## 2022-05-13 NOTE — Discharge Instructions (Addendum)
Your x-rays today did not show any evidence of broken or dislocated bones.  Given that you have pain with flexion of your toes and also with palpation of the soft tissue I suspect that you have inflamed some of the tendons in your foot which is what is causing your pain.  Rest your foot is much as possible and also keep it elevated to decrease any swelling or pain.  You can soak your foot in warm water and Epsom salts as the heat will help improve circulation and resolve inflammation.  Take the prednisone according to package instructions starting tomorrow morning at breakfast.  This will decrease inflammation in your tendons as well.  If your symptoms do not improve please return for reevaluation or see a podiatrist.

## 2022-06-07 ENCOUNTER — Ambulatory Visit
Admission: EM | Admit: 2022-06-07 | Discharge: 2022-06-07 | Disposition: A | Discharge status: Home / Self Care | Payer: BC Managed Care – PPO | Attending: Physician Assistant | Admitting: Physician Assistant

## 2022-06-07 DIAGNOSIS — Z1152 Encounter for screening for COVID-19: Secondary | ICD-10-CM | POA: Insufficient documentation

## 2022-06-07 DIAGNOSIS — J029 Acute pharyngitis, unspecified: Secondary | ICD-10-CM | POA: Insufficient documentation

## 2022-06-07 DIAGNOSIS — J069 Acute upper respiratory infection, unspecified: Secondary | ICD-10-CM | POA: Diagnosis present

## 2022-06-07 DIAGNOSIS — R051 Acute cough: Secondary | ICD-10-CM | POA: Diagnosis present

## 2022-06-07 LAB — SARS CORONAVIRUS 2 BY RT PCR: SARS Coronavirus 2 by RT PCR: NEGATIVE

## 2022-06-07 LAB — GROUP A STREP BY PCR: Group A Strep by PCR: NOT DETECTED

## 2022-06-07 MED ORDER — PROMETHAZINE-DM 6.25-15 MG/5ML PO SYRP: 5.0000 mL | ORAL_SOLUTION | Freq: Four times a day (QID) | ORAL | 0 refills | Status: DC | PRN

## 2022-06-07 MED ORDER — LIDOCAINE VISCOUS HCL 2 % MT SOLN: 15.0000 mL | OROMUCOSAL | 0 refills | Status: DC | PRN

## 2022-06-07 NOTE — ED Provider Notes (Signed)
[facility]    CSN: [id number] Arrival date & time: 06/07/22  1137      History   Chief Complaint Chief Complaint  Patient presents with   Sore Throat   Chills   Cough    HPI Trevor Dickson is a 36 y.o. male presenting for onset of fatigue, sweats, cough, congestion,  and sore throat since this morning.  Patient is most concerned about his sore throat.  He denies any recorded temperatures and has not complained of ear pain, sinus pain, chest pain or breathing difficulty.  No sick contacts in the house but he does have multiple children.  He has taken DayQuil and NyQuil for symptoms.  He denies a history of lung disease.  He vapes every day.  History of bronchitis per chart review.  No other complaints.  HPI  History reviewed. No pertinent past medical history.  There are no problems to display for this patient.   Past Surgical History:  Procedure Laterality Date   APPENDECTOMY         Home Medications    Prior to Admission medications   Medication Sig Start Date End Date Taking? Authorizing Provider  cetirizine (ZYRTEC) 10 MG tablet Take 10 mg by mouth daily.   Yes [provider]  lidocaine (XYLOCAINE) 2 % solution Use as directed 15 mLs in the mouth or throat every 3 (three) hours as needed for mouth pain (swish and spit). 06/07/22  Yes Shirlee Latch, PA-C  promethazine-dextromethorphan (PROMETHAZINE-DM) 6.25-15 MG/5ML syrup Take 5 mLs by mouth 4 (four) times daily as needed. 06/07/22  Yes Eusebio Friendly B, PA-C  albuterol (VENTOLIN HFA) 108 (90 Base) MCG/ACT inhaler Inhale 1-2 puffs into the lungs every 6 (six) hours as needed for wheezing or shortness of breath. 10/14/21   Rhys Martini, PA-C  Azelastine HCl 137 MCG/SPRAY SOLN Place 1 spray into both nostrils 2 (two) times daily. 06/15/21   [provider]  baclofen (LIORESAL) 10 MG tablet Take 1 tablet (10 mg total) by mouth 3 (three) times daily. 04/15/22   Becky Augusta, NP  ipratropium  (ATROVENT) 0.06 % nasal spray Place 2 sprays into both nostrils 4 (four) times daily. 04/15/22   Becky Augusta, NP  ketorolac (TORADOL) 10 MG tablet Take 1 tablet (10 mg total) by mouth every 6 (six) hours as needed. 02/17/22   Rodriguez-Southworth, Nettie Elm, PA-C  Multiple Vitamins-Minerals (GNP ONE DAILY MENS 50+ADVANCED) TABS Take 1 tablet by mouth daily.    [provider]  traZODone (DESYREL) 50 MG tablet Take 50 mg by mouth at bedtime. 01/26/22   [provider]    Family History History reviewed. No pertinent family history.  Social History Social History   Tobacco Use   Smoking status: Never   Smokeless tobacco: Never  Vaping Use   Vaping Use: Every day  Substance Use Topics   Alcohol use: Yes   Drug use: Never     Allergies   Ibuprofen   Review of Systems Review of Systems  Constitutional:  Positive for diaphoresis and fatigue. Negative for fever.  HENT:  Positive for congestion, rhinorrhea and sore throat. Negative for ear pain and sinus pain.   Respiratory:  Positive for cough. Negative for chest tightness and shortness of breath.   Cardiovascular:  Negative for chest pain.  Gastrointestinal:  Negative for abdominal pain, diarrhea, nausea and vomiting.  Musculoskeletal:  Negative for myalgias.  Neurological:  Positive for headaches. Negative for dizziness and weakness.  Physical Exam Triage Vital Signs ED Triage Vitals  Enc Vitals Group     BP      Pulse      Resp      Temp      Temp src      SpO2      Weight      Height      Head Circumference      Peak Flow      Pain Score      Pain Loc      Pain Edu?      Excl. in GC?    No data found.  Updated Vital Signs BP (!) 138/94   Pulse 76   Temp 98 F (36.7 C)   Ht 5\' 6"  (1.676 m)   Wt 185 lb (83.9 kg)   SpO2 99%   BMI 29.86 kg/m      Physical Exam Vitals and nursing note reviewed.  Constitutional:      General: He is not in acute distress.    Appearance: Normal  appearance. He is well-developed. He is ill-appearing.  HENT:     Head: Normocephalic and atraumatic.     Nose: Congestion present.     Mouth/Throat:     Mouth: Mucous membranes are moist.     Pharynx: Oropharynx is clear. Posterior oropharyngeal erythema present.  Eyes:     General: No scleral icterus.    Conjunctiva/sclera: Conjunctivae normal.  Cardiovascular:     Rate and Rhythm: Normal rate and regular rhythm.     Heart sounds: Normal heart sounds.  Pulmonary:     Effort: Pulmonary effort is normal. No respiratory distress.     Breath sounds: Normal breath sounds. No wheezing, rhonchi or rales.  Musculoskeletal:     Cervical back: Neck supple.  Skin:    General: Skin is warm and dry.     Capillary Refill: Capillary refill takes less than 2 seconds.  Neurological:     General: No focal deficit present.     Mental Status: He is alert. Mental status is at baseline.     Motor: No weakness.     Gait: Gait normal.  Psychiatric:        Mood and Affect: Mood normal.        Behavior: Behavior normal.      UC Treatments / Results  Labs (all labs ordered are listed, but only abnormal results are displayed) Labs Reviewed  GROUP A STREP BY PCR  SARS CORONAVIRUS 2 BY RT PCR    EKG   Radiology No results found.  Procedures Procedures (including critical care time)  Medications Ordered in UC Medications - No data to display  Initial Impression / Assessment and Plan / UC Course  I have reviewed the triage vital signs and the nursing notes.  Pertinent labs & imaging results that were available during my care of the patient were reviewed by me and considered in my medical decision making (see chart for details).  Clinical Course as of 06/07/22 1332  Thu Jun 07, 2022  1241 SARS Coronavirus 2 by RT PCR (hospital order, performed in [facility] hospital lab) *cepheid single result test* Anterior Nasal Swab [LE]    Clinical Course User Index [LE] [facility], PA-C   56 male presenting for cough, congestion, sore throat, fatigue since this morning.  No sick contacts.  BP elevated at 138/94.  He is afebrile.  Oxygen 99% and he is in no distress.  Appears mildly  ill and fatigued but nontoxic.  On exam has nasal congestion and mild erythema posterior pharynx.  Chest clear to auscultation in all lung fields.  PCR strep test obtained.  COVID testing also obtained.  Negative strep and COVID.  Result communicated to patient.  Patient's symptoms still consistent with viral illness.  Advised him to try the viscous lidocaine and Promethazine DM.  Plenty rest and fluids.  Advised him to return if he develops a fever, worsening cough or increased shortness of breath.  Final Clinical Impressions(s) / UC Diagnoses   Final diagnoses:  Viral upper respiratory tract infection  Acute cough  Sore throat     Discharge Instructions      -Negative strep and COVID testing.   URI/COLD SYMPTOMS: Your exam today is consistent with a viral illness. Antibiotics are not indicated at this time. Use medications as directed, including cough syrup, nasal saline, and decongestants. Your symptoms should improve over the next few days and resolve within 7-10 days. Increase rest and fluids. F/u if symptoms worsen or predominate such as sore throat, ear pain, productive cough, shortness of breath, or if you develop high fevers or worsening fatigue over the next several days.         ED Prescriptions     Medication Sig Dispense Auth. Provider   promethazine-dextromethorphan (PROMETHAZINE-DM) 6.25-15 MG/5ML syrup Take 5 mLs by mouth 4 (four) times daily as needed. 118 mL Laurene Footman B, PA-C   lidocaine (XYLOCAINE) 2 % solution Use as directed 15 mLs in the mouth or throat every 3 (three) hours as needed for mouth pain (swish and spit). 100 mL Danton Clap, PA-C      PDMP not reviewed this encounter.     Danton Clap, PA-C 06/07/22 1333

## 2022-06-07 NOTE — Discharge Instructions (Addendum)
-  Negative strep and COVID testing.  URI/COLD SYMPTOMS: Your exam today is consistent with a viral illness. Antibiotics are not indicated at this time. Use medications as directed, including cough syrup, nasal saline, and decongestants. Your symptoms should improve over the next few days and resolve within 7-10 days. Increase rest and fluids. F/u if symptoms worsen or predominate such as sore throat, ear pain, productive cough, shortness of breath, or if you develop high fevers or worsening fatigue over the next several days.   

## 2022-06-07 NOTE — ED Triage Notes (Addendum)
Patient reports that he has had a dry cough that started yesterday. Patient reports a little sore throat. Patient reports chills.

## 2022-06-20 ENCOUNTER — Ambulatory Visit
Admission: EM | Admit: 2022-06-20 | Discharge: 2022-06-20 | Disposition: A | Discharge status: Home / Self Care | Payer: BC Managed Care – PPO | Attending: Family Medicine | Admitting: Family Medicine

## 2022-06-20 ENCOUNTER — Encounter: Payer: Self-pay | Admitting: Emergency Medicine

## 2022-06-20 DIAGNOSIS — B349 Viral infection, unspecified: Secondary | ICD-10-CM | POA: Diagnosis not present

## 2022-06-20 DIAGNOSIS — Z79899 Other long term (current) drug therapy: Secondary | ICD-10-CM | POA: Diagnosis not present

## 2022-06-20 DIAGNOSIS — Z1152 Encounter for screening for COVID-19: Secondary | ICD-10-CM | POA: Insufficient documentation

## 2022-06-20 LAB — GROUP A STREP BY PCR: Group A Strep by PCR: NOT DETECTED

## 2022-06-20 LAB — RESP PANEL BY RT-PCR (FLU A&B, COVID) ARPGX2
Influenza A by PCR: NEGATIVE
Influenza B by PCR: NEGATIVE
SARS Coronavirus 2 by RT PCR: NEGATIVE

## 2022-06-20 MED ORDER — ALBUTEROL SULFATE HFA 108 (90 BASE) MCG/ACT IN AERS: 1.0000 | INHALATION_SPRAY | Freq: Four times a day (QID) | RESPIRATORY_TRACT | 1 refills | Status: AC | PRN

## 2022-06-20 MED ORDER — PROMETHAZINE-DM 6.25-15 MG/5ML PO SYRP: 5.0000 mL | ORAL_SOLUTION | Freq: Four times a day (QID) | ORAL | 0 refills | Status: DC | PRN

## 2022-06-20 NOTE — Discharge Instructions (Signed)
We will contact you if your strep, influenza or COVID test is positive.  Please quarantine while you wait for the results.  If your test is negative, you likely have a common respiratory virus.  You may resume normal activities.  If your test is positive please continue to quarantine for at least 5 days from your symptom onset or until you are without a fever for at least 24 hours after the medications.   You can take Tylenol and/or Ibuprofen as needed for fever reduction and pain relief.    For cough: honey 1/2 to 1 teaspoon (you can dilute the honey in water or another fluid).  You can also use guaifenesin and dextromethorphan for cough. You can use a humidifier for chest congestion and cough.  If you don't have a humidifier, you can sit in the bathroom with the hot shower running.      For sore throat: try warm salt water gargles, cepacol lozenges, throat spray, warm tea or water with lemon/honey, popsicles or ice, or OTC cold relief medicine for throat discomfort.    For congestion: take a daily anti-histamine like Zyrtec, Claritin, and a oral decongestant, such as pseudoephedrine.  You can also use Flonase 1-2 sprays in each nostril daily.    It is important to stay hydrated: drink plenty of fluids (water, gatorade/powerade/pedialyte, juices, or teas) to keep your throat moisturized and help further relieve irritation/discomfort.    Return or go to the Emergency Department if symptoms worsen or do not improve in the next few days

## 2022-06-20 NOTE — ED Triage Notes (Signed)
Pt presents with cough, bilateral ear pain, runny nose and sore throat x 2 days

## 2022-06-20 NOTE — ED Provider Notes (Signed)
[facility]    CSN: [id number] Arrival date & time: 06/20/22  4696      History   Chief Complaint Chief Complaint  Patient presents with   Cough   Sore Throat   Otalgia    HPI Trevor Dickson is a 36 y.o. male.   HPI   Trevor Dickson presents for sore throat and cough for the past 2-3 days. His back hurts from coughing. Endorses myalgias. He has ear pain and nasal congestion. Has chills but no fever.  Had dark stools that was malodorous. Has had no nausea or vomiting. He is not sleeping well.  Has rhinorrhea and nasal congestion. He was a couple months premature. No history of asthma. He has been taking DayQuil and NyQuil without significant relief.        History reviewed. No pertinent past medical history.  There are no problems to display for this patient.   Past Surgical History:  Procedure Laterality Date   APPENDECTOMY         Home Medications    Prior to Admission medications   Medication Sig Start Date End Date Taking? Authorizing Provider  albuterol (VENTOLIN HFA) 108 (90 Base) MCG/ACT inhaler Inhale 1-2 puffs into the lungs every 6 (six) hours as needed for wheezing or shortness of breath. 06/20/22   Neidy Guerrieri, Seward Meth, DO  Azelastine HCl 137 MCG/SPRAY SOLN Place 1 spray into both nostrils 2 (two) times daily. 06/15/21   [provider]  baclofen (LIORESAL) 10 MG tablet Take 1 tablet (10 mg total) by mouth 3 (three) times daily. 04/15/22   Becky Augusta, NP  cetirizine (ZYRTEC) 10 MG tablet Take 10 mg by mouth daily.    [provider]  ipratropium (ATROVENT) 0.06 % nasal spray Place 2 sprays into both nostrils 4 (four) times daily. 04/15/22   Becky Augusta, NP  ketorolac (TORADOL) 10 MG tablet Take 1 tablet (10 mg total) by mouth every 6 (six) hours as needed. 02/17/22   Rodriguez-Southworth, Nettie Elm, PA-C  lidocaine (XYLOCAINE) 2 % solution Use as directed 15 mLs in the mouth or throat every 3 (three) hours as needed for mouth pain (swish and  spit). 06/07/22   Shirlee Latch, PA-C  Multiple Vitamins-Minerals (GNP ONE DAILY MENS 50+ADVANCED) TABS Take 1 tablet by mouth daily.    [provider]  promethazine-dextromethorphan (PROMETHAZINE-DM) 6.25-15 MG/5ML syrup Take 5 mLs by mouth 4 (four) times daily as needed. 06/20/22   Katha Cabal, DO  traZODone (DESYREL) 50 MG tablet Take 50 mg by mouth at bedtime. 01/26/22   [provider]    Family History No family history on file.  Social History Social History   Tobacco Use   Smoking status: Never   Smokeless tobacco: Never  Vaping Use   Vaping Use: Every day  Substance Use Topics   Alcohol use: Yes   Drug use: Never     Allergies   Ibuprofen   Review of Systems Review of Systems: negative unless otherwise stated in HPI.      Physical Exam Triage Vital Signs ED Triage Vitals  Enc Vitals Group     BP 06/20/22 1042 128/87     Pulse Rate 06/20/22 1042 68     Resp 06/20/22 1042 16     Temp 06/20/22 1042 98.3 F (36.8 C)     Temp Source 06/20/22 1042 Oral     SpO2 06/20/22 1042 96 %     Weight --      Height --  Head Circumference --      Peak Flow --      Pain Score 06/20/22 1041 6     Pain Loc --      Pain Edu? --      Excl. in [place]? --    No data found.  Updated Vital Signs BP 128/87 (BP Location: Left Arm)   Pulse 68   Temp 98.3 F (36.8 C) (Oral)   Resp 16   SpO2 96%   Visual Acuity Right Eye Distance:   Left Eye Distance:   Bilateral Distance:    Right Eye Near:   Left Eye Near:    Bilateral Near:     Physical Exam GEN:     alert, non-toxic appearing male in no distress     HENT:  mucus membranes moist, oropharyngeal  without lesions or  exudate, no  tonsillar hypertrophy, moderate oropharyngeal erythema, clear nasal discharge,  bilateral TM  normal EYES:   pupils equal and reactive, EOMi ,  no scleral injection NECK:  normal ROM, no  lymphadenopathy  RESP:  no increased work of breathing,  clear to  auscultation bilaterally CVS:   regular rate  and rhythm Skin:   warm and dry, no rash on visible skin     UC Treatments / Results  Labs (all labs ordered are listed, but only abnormal results are displayed) Labs Reviewed  RESP PANEL BY RT-PCR (FLU A&B, COVID) [id number]  GROUP A STREP BY PCR    EKG   Radiology No results found.  Procedures Procedures (including critical care time)  Medications Ordered in UC Medications - No data to display  Initial Impression / Assessment and Plan / UC Course  I have reviewed the triage vital signs and the nursing notes.  Pertinent labs & imaging results that were available during my care of the patient were reviewed by me and considered in my medical decision making (see chart for details).       Pt is a 36 y.o. male ex-preemie who presents for 2-3 days of respiratory symptoms. Damarcus is  afebrile here without recent antipyretics. Satting well on room air. Overall pt is non-toxic appearing, well hydrated, without respiratory distress. Pulmonary exam  is unremarkable.  Strep PCR, COVID and influenza testing obtained. Pt to quarantine until COVID test results or longer if positive.  I will call patient with test results, if positive. History consistent with  viral respiratory illness. Discussed symptomatic treatment.  Albuterol for bronchospasm Promethazine DM for cough.  Explained lack of efficacy of antibiotics in viral disease.  Typical duration of symptoms discussed.  Return and ED precautions given and patient voiced understanding.  Discussed MDM, treatment plan and plan for follow-up with patient who agrees with plan.     Final Clinical Impressions(s) / UC Diagnoses   Final diagnoses:  Viral illness     Discharge Instructions      We will contact you if your strep, influenza or COVID test is positive.  Please quarantine while you wait for the results.  If your test is negative, you likely have a common respiratory virus.  You may  resume normal activities.  If your test is positive please continue to quarantine for at least 5 days from your symptom onset or until you are without a fever for at least 24 hours after the medications.   You can take Tylenol and/or Ibuprofen as needed for fever reduction and pain relief.    For cough: honey 1/2 to 1 teaspoon (  you can dilute the honey in water or another fluid).  You can also use guaifenesin and dextromethorphan for cough. You can use a humidifier for chest congestion and cough.  If you don't have a humidifier, you can sit in the bathroom with the hot shower running.      For sore throat: try warm salt water gargles, cepacol lozenges, throat spray, warm tea or water with lemon/honey, popsicles or ice, or OTC cold relief medicine for throat discomfort.    For congestion: take a daily anti-histamine like Zyrtec, Claritin, and a oral decongestant, such as pseudoephedrine.  You can also use Flonase 1-2 sprays in each nostril daily.    It is important to stay hydrated: drink plenty of fluids (water, gatorade/powerade/pedialyte, juices, or teas) to keep your throat moisturized and help further relieve irritation/discomfort.    Return or go to the Emergency Department if symptoms worsen or do not improve in the next few days      ED Prescriptions     Medication Sig Dispense Auth. Provider   albuterol (VENTOLIN HFA) 108 (90 Base) MCG/ACT inhaler Inhale 1-2 puffs into the lungs every 6 (six) hours as needed for wheezing or shortness of breath. 1 each Katha Cabal, DO   promethazine-dextromethorphan (PROMETHAZINE-DM) 6.25-15 MG/5ML syrup Take 5 mLs by mouth 4 (four) times daily as needed. 118 mL Katha Cabal, DO      PDMP not reviewed this encounter.   Katha Cabal, DO 06/20/22 1159

## 2022-10-24 ENCOUNTER — Ambulatory Visit (INDEPENDENT_AMBULATORY_CARE_PROVIDER_SITE_OTHER): Payer: BC Managed Care – PPO

## 2022-10-24 ENCOUNTER — Encounter: Payer: Self-pay | Admitting: Emergency Medicine

## 2022-10-24 ENCOUNTER — Ambulatory Visit
Admission: EM | Admit: 2022-10-24 | Discharge: 2022-10-24 | Disposition: A | Discharge status: Home / Self Care | Payer: BC Managed Care – PPO | Attending: Family Medicine | Admitting: Family Medicine

## 2022-10-24 DIAGNOSIS — S29019D Strain of muscle and tendon of unspecified wall of thorax, subsequent encounter: Secondary | ICD-10-CM

## 2022-10-24 DIAGNOSIS — M5135 Other intervertebral disc degeneration, thoracolumbar region: Secondary | ICD-10-CM | POA: Diagnosis not present

## 2022-10-24 MED ORDER — HYDROCODONE-ACETAMINOPHEN 5-325 MG PO TABS: 1.0000 | ORAL_TABLET | Freq: Four times a day (QID) | ORAL | 0 refills | Status: DC | PRN

## 2022-10-24 MED ORDER — KETOROLAC TROMETHAMINE 30 MG/ML IJ SOLN
30.0000 mg | Freq: Once | INTRAMUSCULAR | Status: AC
  Administered 2022-10-24: 30 mg via INTRAMUSCULAR

## 2022-10-24 MED ORDER — PREDNISONE 10 MG (21) PO TBPK: ORAL_TABLET | Freq: Every day | ORAL | 0 refills | Status: DC

## 2022-10-24 MED ORDER — CYCLOBENZAPRINE HCL 10 MG PO TABS: 10.0000 mg | ORAL_TABLET | Freq: Three times a day (TID) | ORAL | 0 refills | Status: DC | PRN

## 2022-10-24 NOTE — Discharge Instructions (Addendum)
Your xray did not identify the cause of your pain. They showed some mild degenerative disc disease. You may need an MRI to better determine the cause of your pain.    If medication was prescribed, stop by the pharmacy to pick up your prescriptions.  For your back pain, Take 1500 mg Tylenol twice a day, take muscle relaxer (Flexeril) 2-3 times a day, take Naprosyn twice a day,  as needed for pain. Consider stopping by the pharmacy or dollar store to pick up some Lidocaine patches. Apply for 12 hours and then remove. For severe pain, take Norco. Do not drive or operate heavy machinery while taking muscle relaxer or Norco.   Watch for worsening symptoms such as an increasing weakness or loss of sensation in your arms or legs, increasing pain and/or the loss of bladder or bowel function. Should any of these occur, go to the emergency department immediately.   If pain does not improve, see Dr Rosette Reveal or go to [facility] in [place].

## 2022-10-24 NOTE — ED Triage Notes (Signed)
Pt presents with worsening back pain. He has chronic back pain and does PT, but today he states the pain is unbearable.

## 2022-10-24 NOTE — ED Provider Notes (Signed)
[facility]    CSN: [id number] Arrival date & time: 10/24/22  1716      History   Chief Complaint Chief Complaint  Patient presents with   Back Pain    HPI  HPI Trevor Dickson is a 37 y.o. male here for back pain. Pain has been going on for several weeks. Has had back issues since rearended since he was 37 yo.   He has been going to physical therapy for a bad flare. He didn't get much sleep last night. He was not able to get into the car to get to work. He sits on a yoga ball at work. He has to stand up multuiple times an hour to relief worsening back. He is the most comfortable laying on an ice pack and performing his PT exercises. Has a 39 yo at home.     Fever : no  Weight loss: not unintended  Perianal numbness: no Bowel incontinence: no Bladder incontinence: no Trauma: no  Sore throat: no   Cough: no Hydration: normal  Abdominal pain: no Nausea: no Vomiting: no Abdominal pain: no Dysuria:no Sleep disturbance: yes Neck Pain: yes Headache: not new     History reviewed. No pertinent past medical history.  There are no problems to display for this patient.   Past Surgical History:  Procedure Laterality Date   APPENDECTOMY         Home Medications    Prior to Admission medications   Medication Sig Start Date End Date Taking? Authorizing Provider  cyclobenzaprine (FLEXERIL) 10 MG tablet Take 1 tablet (10 mg total) by mouth 3 (three) times daily as needed for muscle spasms. 10/24/22  Yes Maday Guarino, Ronnette Juniper, DO  HYDROcodone-acetaminophen (NORCO/VICODIN) 5-325 MG tablet Take 1 tablet by mouth every 6 (six) hours as needed. 10/24/22  Yes Riham Polyakov, Ronnette Juniper, DO  hyoscyamine (LEVSIN) 0.125 MG tablet Take by mouth. 01/26/22  Yes [provider]  metaxalone (SKELAXIN) 400 MG tablet Take by mouth. 10/22/22  Yes [provider]  predniSONE (STERAPRED UNI-PAK 21 TAB) 10 MG (21) TBPK tablet Take by mouth daily. Take 6 tabs by mouth daily for 1,  then 5 tabs for 1 day, then 4 tabs for 1 day, then 3 tabs for 1 day, then 2 tabs for 1 day, then 1 tab for 1 day. 10/24/22  Yes Deari Sessler, DO  albuterol (VENTOLIN HFA) 108 (90 Base) MCG/ACT inhaler Inhale 1-2 puffs into the lungs every 6 (six) hours as needed for wheezing or shortness of breath. 06/20/22   Daegan Arizmendi, Ronnette Juniper, DO  Azelastine HCl 137 MCG/SPRAY SOLN Place 1 spray into both nostrils 2 (two) times daily. 06/15/21   [provider]  cetirizine (ZYRTEC) 10 MG tablet Take 10 mg by mouth daily.    [provider]  ipratropium (ATROVENT) 0.06 % nasal spray Place 2 sprays into both nostrils 4 (four) times daily. 04/15/22   Margarette Canada, NP  ketorolac (TORADOL) 10 MG tablet Take 1 tablet (10 mg total) by mouth every 6 (six) hours as needed. 02/17/22   Rodriguez-Southworth, Sunday Spillers, PA-C  lidocaine (XYLOCAINE) 2 % solution Use as directed 15 mLs in the mouth or throat every 3 (three) hours as needed for mouth pain (swish and spit). 06/07/22   Danton Clap, PA-C  Multiple Vitamins-Minerals (GNP ONE DAILY MENS 50+ADVANCED) TABS Take 1 tablet by mouth daily.    [provider]  Naproxen Sodium 220 MG CAPS Take by mouth.    [provider]  promethazine-dextromethorphan (PROMETHAZINE-DM)  6.25-15 MG/5ML syrup Take 5 mLs by mouth 4 (four) times daily as needed. 06/20/22   Lyndee Hensen, DO  traZODone (DESYREL) 50 MG tablet Take 50 mg by mouth at bedtime. 01/26/22   [provider]    Family History No family history on file.  Social History Social History   Tobacco Use   Smoking status: Former    Types: Cigarettes   Smokeless tobacco: Never  Vaping Use   Vaping Use: Every day  Substance Use Topics   Alcohol use: Yes   Drug use: Never     Allergies   Ibuprofen   Review of Systems Review of Systems: egative unless otherwise stated in HPI.      Physical Exam Triage Vital Signs ED Triage Vitals  Enc Vitals Group     BP 10/24/22 1802  (!) 142/93     Pulse Rate 10/24/22 1802 80     Resp 10/24/22 1802 18     Temp 10/24/22 1802 98.2 F (36.8 C)     Temp Source 10/24/22 1802 Oral     SpO2 10/24/22 1802 99 %     Weight --      Height --      Head Circumference --      Peak Flow --      Pain Score 10/24/22 1800 5     Pain Loc --      Pain Edu? --      Excl. in [place]? --    No data found.  Updated Vital Signs BP (!) 142/93 (BP Location: Left Arm)   Pulse 80   Temp 98.2 F (36.8 C) (Oral)   Resp 18   SpO2 99%   Visual Acuity Right Eye Distance:   Left Eye Distance:   Bilateral Distance:    Right Eye Near:   Left Eye Near:    Bilateral Near:     Physical Exam GEN: well appearing male in no acute distress  CVS: well perfused  RESP: speaking in full sentences without pause, no respiratory distress  MSK: Spine: - Inspection: no gross deformity or asymmetry, swelling or ecchymosis. No skin changes  - Palpation: TTP over several spinous processes and bilateral lumbar paraspinal muscles with light palpation, greatest in the thoracic region - ROM: limited ROM due to acute pain  - Strength: 5/5 strength of upper and lower extremity - Neuro: sensation intact  SKIN: warm, dry, no overly skin rash or erythema    UC Treatments / Results  Labs (all labs ordered are listed, but only abnormal results are displayed) Labs Reviewed - No data to display  EKG   Radiology DG Lumbar Spine Complete  Result Date: 10/24/2022 CLINICAL DATA:  Acute on chronic pain EXAM: LUMBAR SPINE - COMPLETE 4 VIEW; THORACIC SPINE 2 VIEWS COMPARISON:  None Available. FINDINGS: There is no evidence of thoracic or lumbar spine fracture. Alignment is normal. Mild multilevel degenerative disc disease, most pronounced at L5-S1 where there is mild disc space height loss. Intervertebral disc spaces are maintained. IMPRESSION: 1. No radiographic evidence of thoracic or lumbar spine fracture. 2. Mild degenerative disc disease. Electronically Signed    By: Yetta Glassman M.D.   On: 10/24/2022 19:32   DG Thoracic Spine 2 View  Result Date: 10/24/2022 CLINICAL DATA:  Acute on chronic pain EXAM: LUMBAR SPINE - COMPLETE 4 VIEW; THORACIC SPINE 2 VIEWS COMPARISON:  None Available. FINDINGS: There is no evidence of thoracic or lumbar spine fracture. Alignment is normal. Mild multilevel degenerative  disc disease, most pronounced at L5-S1 where there is mild disc space height loss. Intervertebral disc spaces are maintained. IMPRESSION: 1. No radiographic evidence of thoracic or lumbar spine fracture. 2. Mild degenerative disc disease. Electronically Signed   By: Yetta Glassman M.D.   On: 10/24/2022 19:32     Procedures Procedures (including critical care time)  Medications Ordered in UC Medications  ketorolac (TORADOL) 30 MG/ML injection 30 mg (30 mg Intramuscular Given 10/24/22 1906)    Initial Impression / Assessment and Plan / UC Course  I have reviewed the triage vital signs and the nursing notes.  Pertinent labs & imaging results that were available during my care of the patient were reviewed by me and considered in my medical decision making (see chart for details).      Pt is a 37 y.o.  male with acute on chronic thoracic pain that radiates to neck and lower back.  On chart review, he has not had any lumbar or spinal imaging since February 2011 at [facility].  These x-rays are not available for review but the impression states that there was no acute fracture, spondylolisthesis or significant degenerative disease.  Given his acute flare that is not improving with significant tenderness to light palpation will obtain thoracic and lumbar plain films.  Patient offered p.o. versus IM pain control and he preferred IM pain control.  Given 30 mg IM Toradol.  Xrays personally reviewed by me were unremarkable for fracture, significant malalignment or dislocation.  Radiologist notes mild degenerative disc disease.    Patient to gradually return to  normal activities, as tolerated and continue ordinary activities within the limits permitted by pain. Prescribed steroid taper, Norco and muscle relaxer  for pain relief.  Tylenol and Lidocaine patches PRN for multimodal pain relief. Counseled patient on red flag symptoms and when to seek immediate care.  No red flags suggesting cauda equina syndrome or progressive major motor weakness. Patient to follow up with orthopedic provider if symptoms do not improve with conservative treatment.  Return and ED precautions given.   Discussed MDM, treatment plan and plan for follow-up with patient who agrees with plan.   Final Clinical Impressions(s) / UC Diagnoses   Final diagnoses:  DDD (degenerative disc disease), thoracolumbar  Thoracic myofascial strain, subsequent encounter     Discharge Instructions      Your xray did not identify the cause of your pain. They showed some mild degenerative disc disease. You may need an MRI to better determine the cause of your pain.    If medication was prescribed, stop by the pharmacy to pick up your prescriptions.  For your back pain, Take 1500 mg Tylenol twice a day, take muscle relaxer (Flexeril) 2-3 times a day, take Naprosyn twice a day,  as needed for pain. Consider stopping by the pharmacy or dollar store to pick up some Lidocaine patches. Apply for 12 hours and then remove. For severe pain, take Norco. Do not drive or operate heavy machinery while taking muscle relaxer or Norco.   Watch for worsening symptoms such as an increasing weakness or loss of sensation in your arms or legs, increasing pain and/or the loss of bladder or bowel function. Should any of these occur, go to the emergency department immediately.   If pain does not improve, see Dr Rosette Reveal or go to [facility] in [place].       ED Prescriptions     Medication Sig Dispense Auth. Provider   predniSONE (STERAPRED UNI-PAK 21 TAB)  10 MG (21) TBPK tablet Take by mouth daily. Take 6  tabs by mouth daily for 1, then 5 tabs for 1 day, then 4 tabs for 1 day, then 3 tabs for 1 day, then 2 tabs for 1 day, then 1 tab for 1 day. 21 tablet Hiep Ollis, DO   HYDROcodone-acetaminophen (NORCO/VICODIN) 5-325 MG tablet Take 1 tablet by mouth every 6 (six) hours as needed. 10 tablet Paytan Recine, DO   cyclobenzaprine (FLEXERIL) 10 MG tablet Take 1 tablet (10 mg total) by mouth 3 (three) times daily as needed for muscle spasms. 30 tablet Harleyquinn Gasser, Ronnette Juniper, DO      I have reviewed the PDMP during this encounter.   Lyndee Hensen, DO 10/27/22 1328

## 2022-11-04 IMAGING — CR DG CHEST 2V
2 series · 2 of 2 positions shown · non-contrast
Comparison: None.

CLINICAL DATA: Cough and shortness of breath.

EXAM:
CHEST - 2 VIEW

[chest pa]
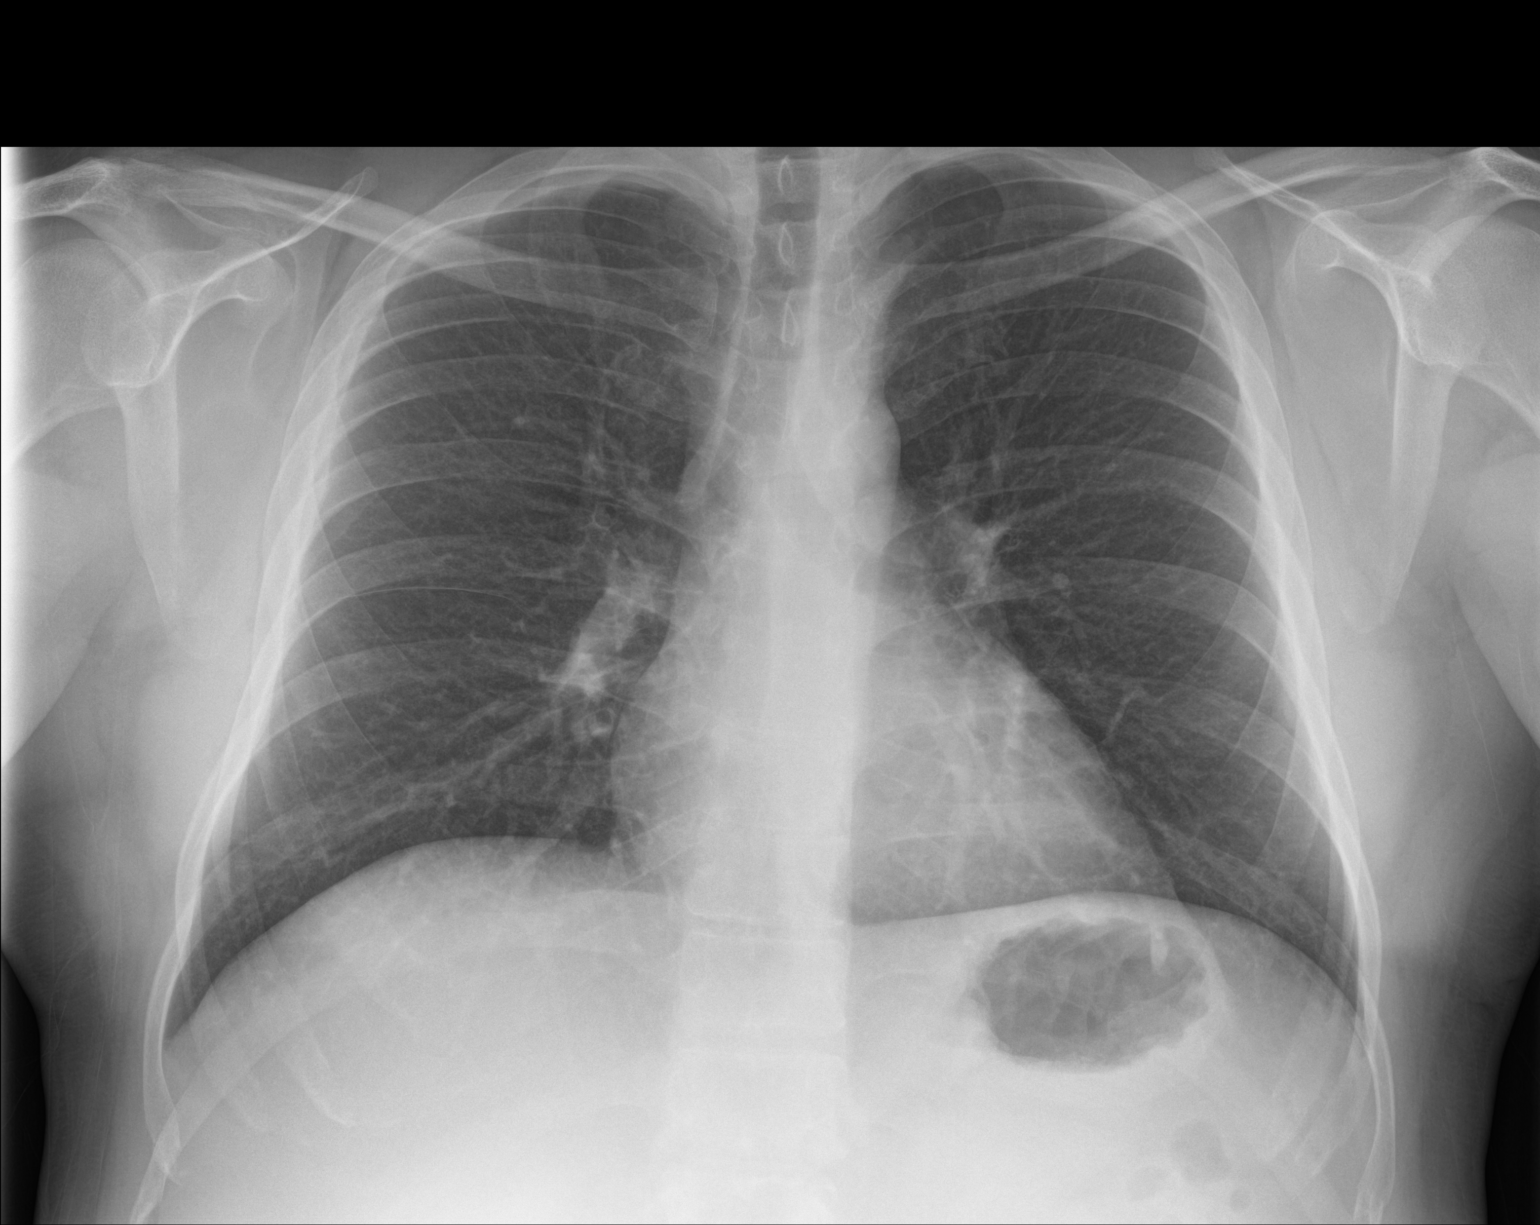

[chest lat]
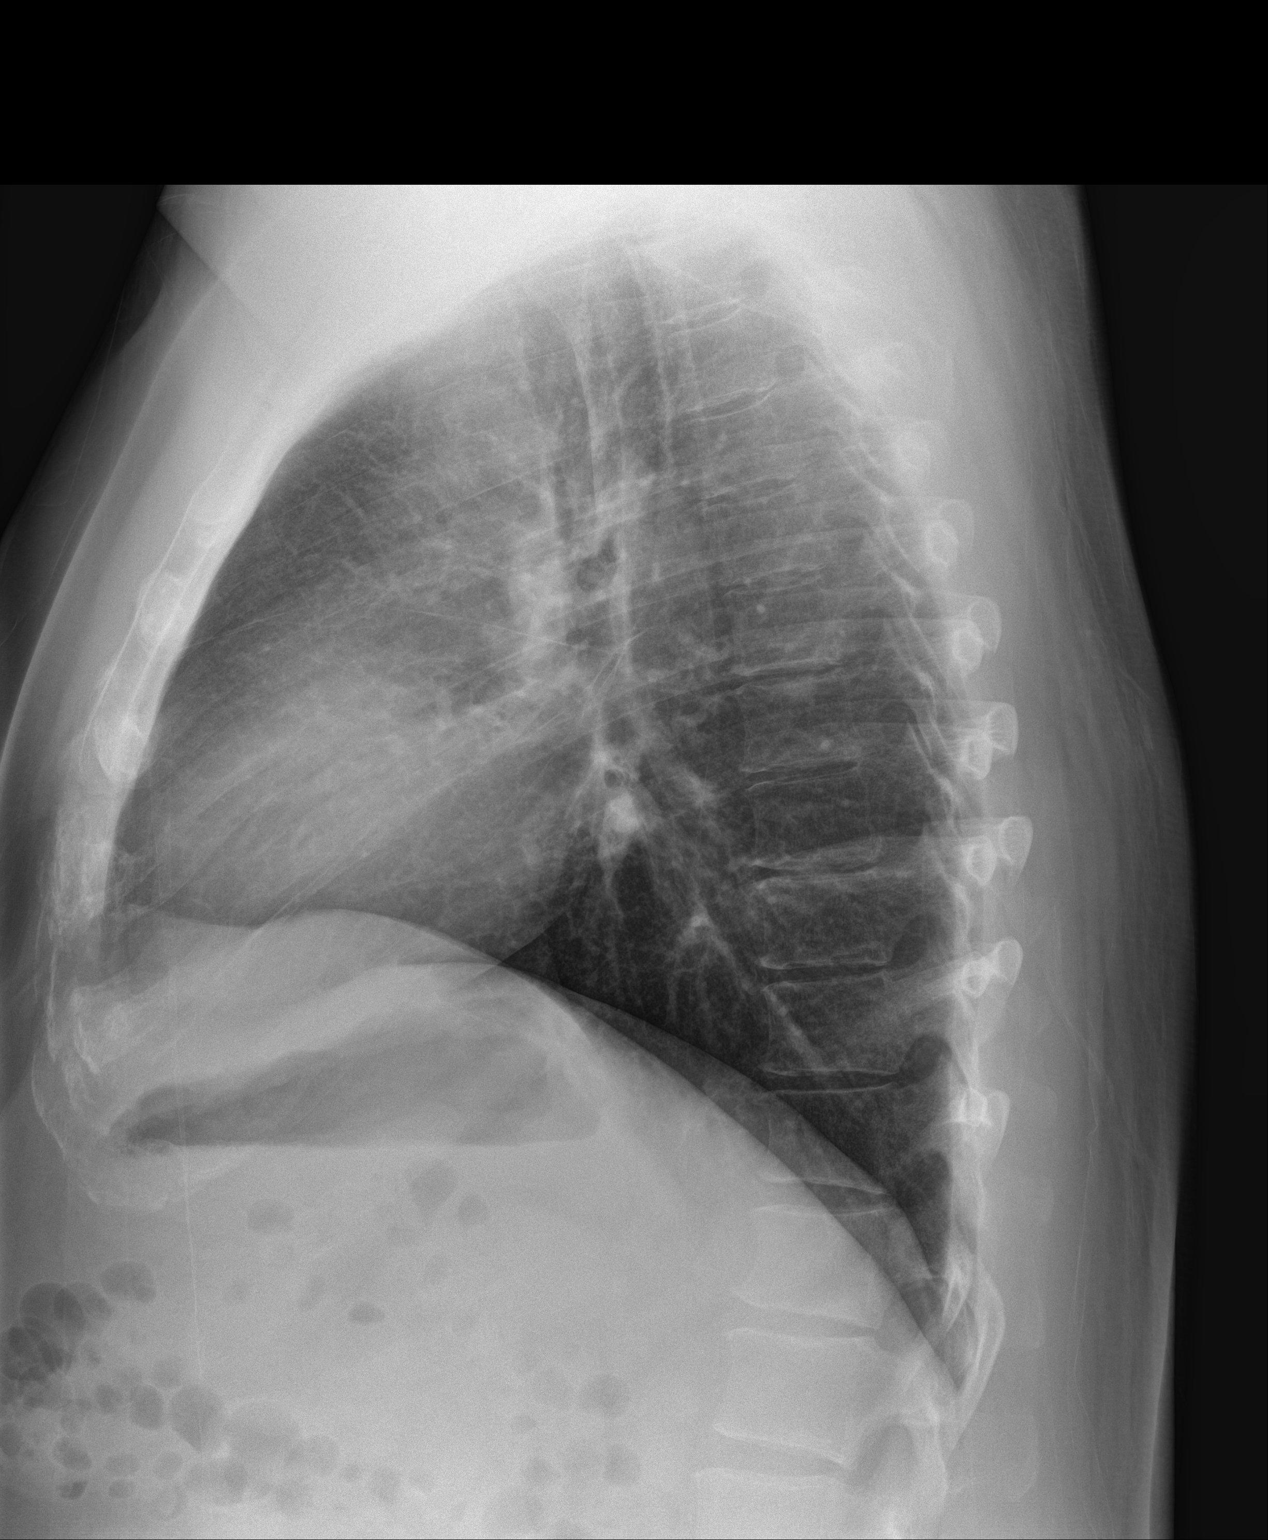

[2 of 2 positions shown; findings below may reference images not displayed]

FINDINGS: The lungs are clear without focal pneumonia, edema, pneumothorax or
pleural effusion. The cardiopericardial silhouette is within normal
limits for size. The visualized bony structures of the thorax show
no acute abnormality.
IMPRESSION: No active cardiopulmonary disease.

## 2023-01-11 IMAGING — CR DG CHEST 2V
2 series · 2 of 2 positions shown · non-contrast
Comparison: Chest two views 08/10/2021

CLINICAL DATA: Cough for 1.5 weeks.  Shortness of breath.

EXAM:
CHEST - 2 VIEW

[chest pa]
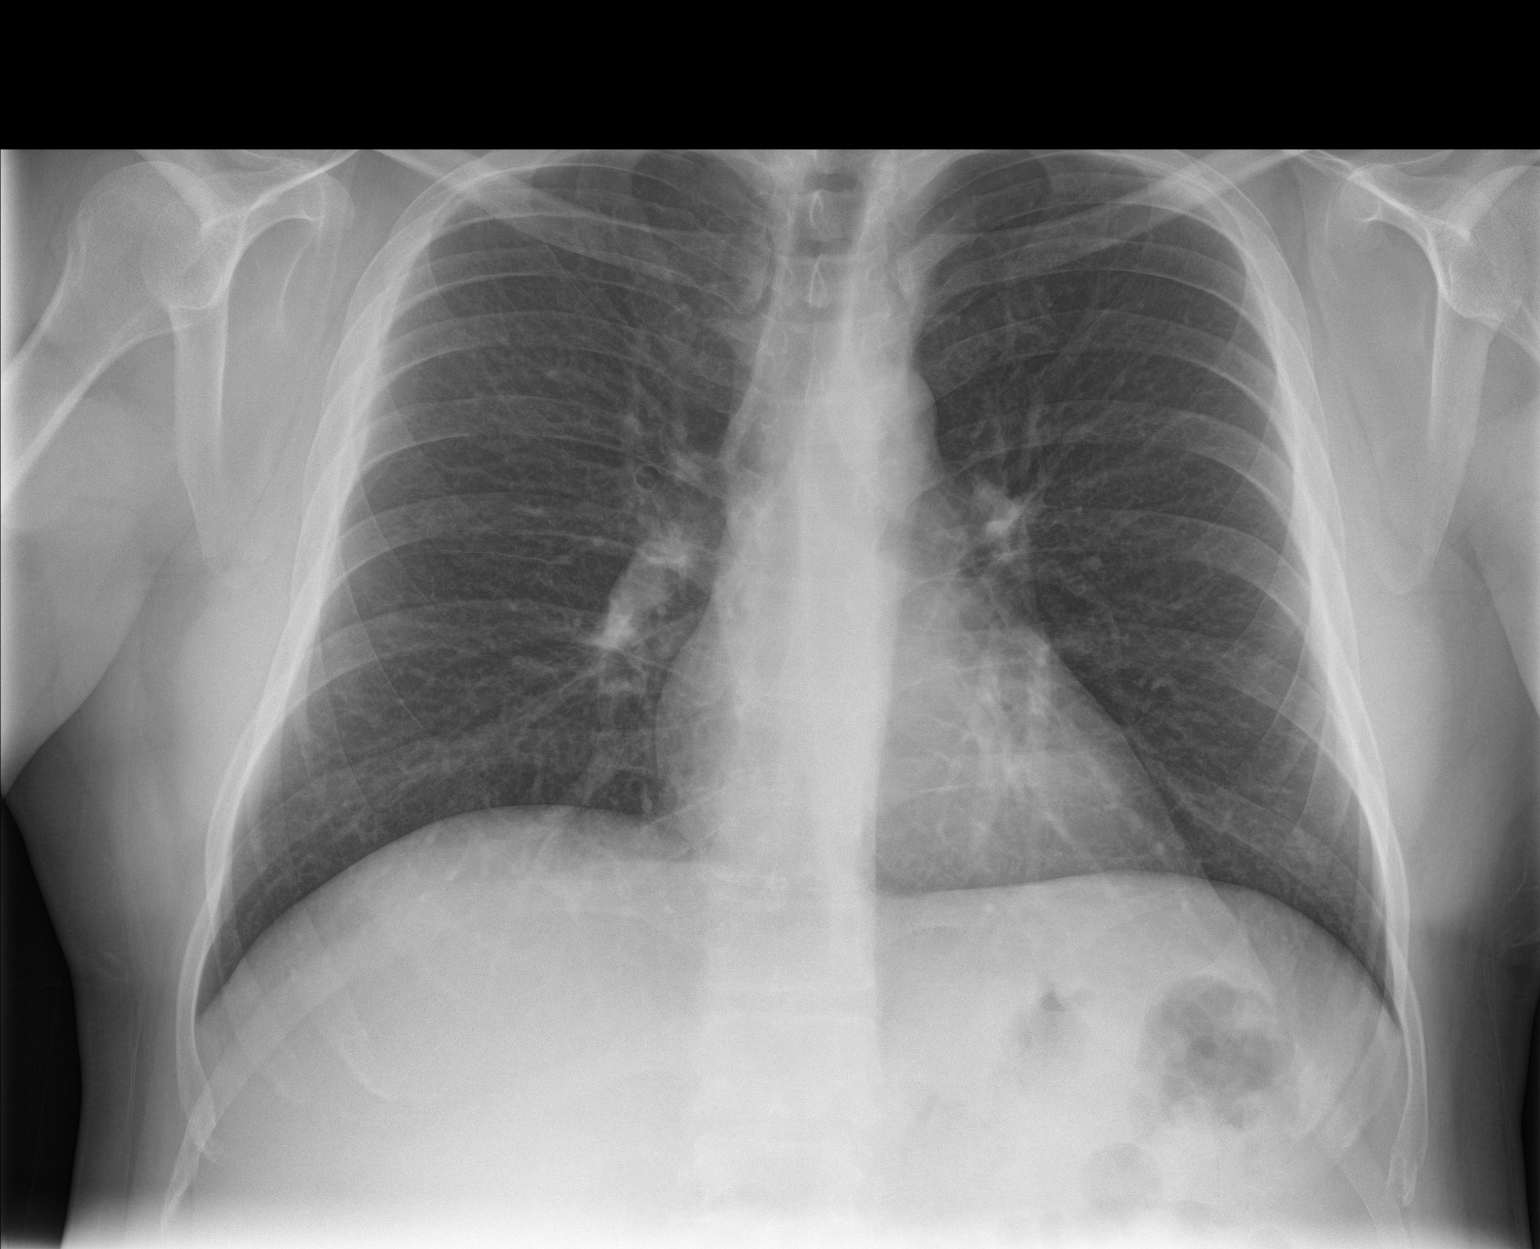

[chest lat]
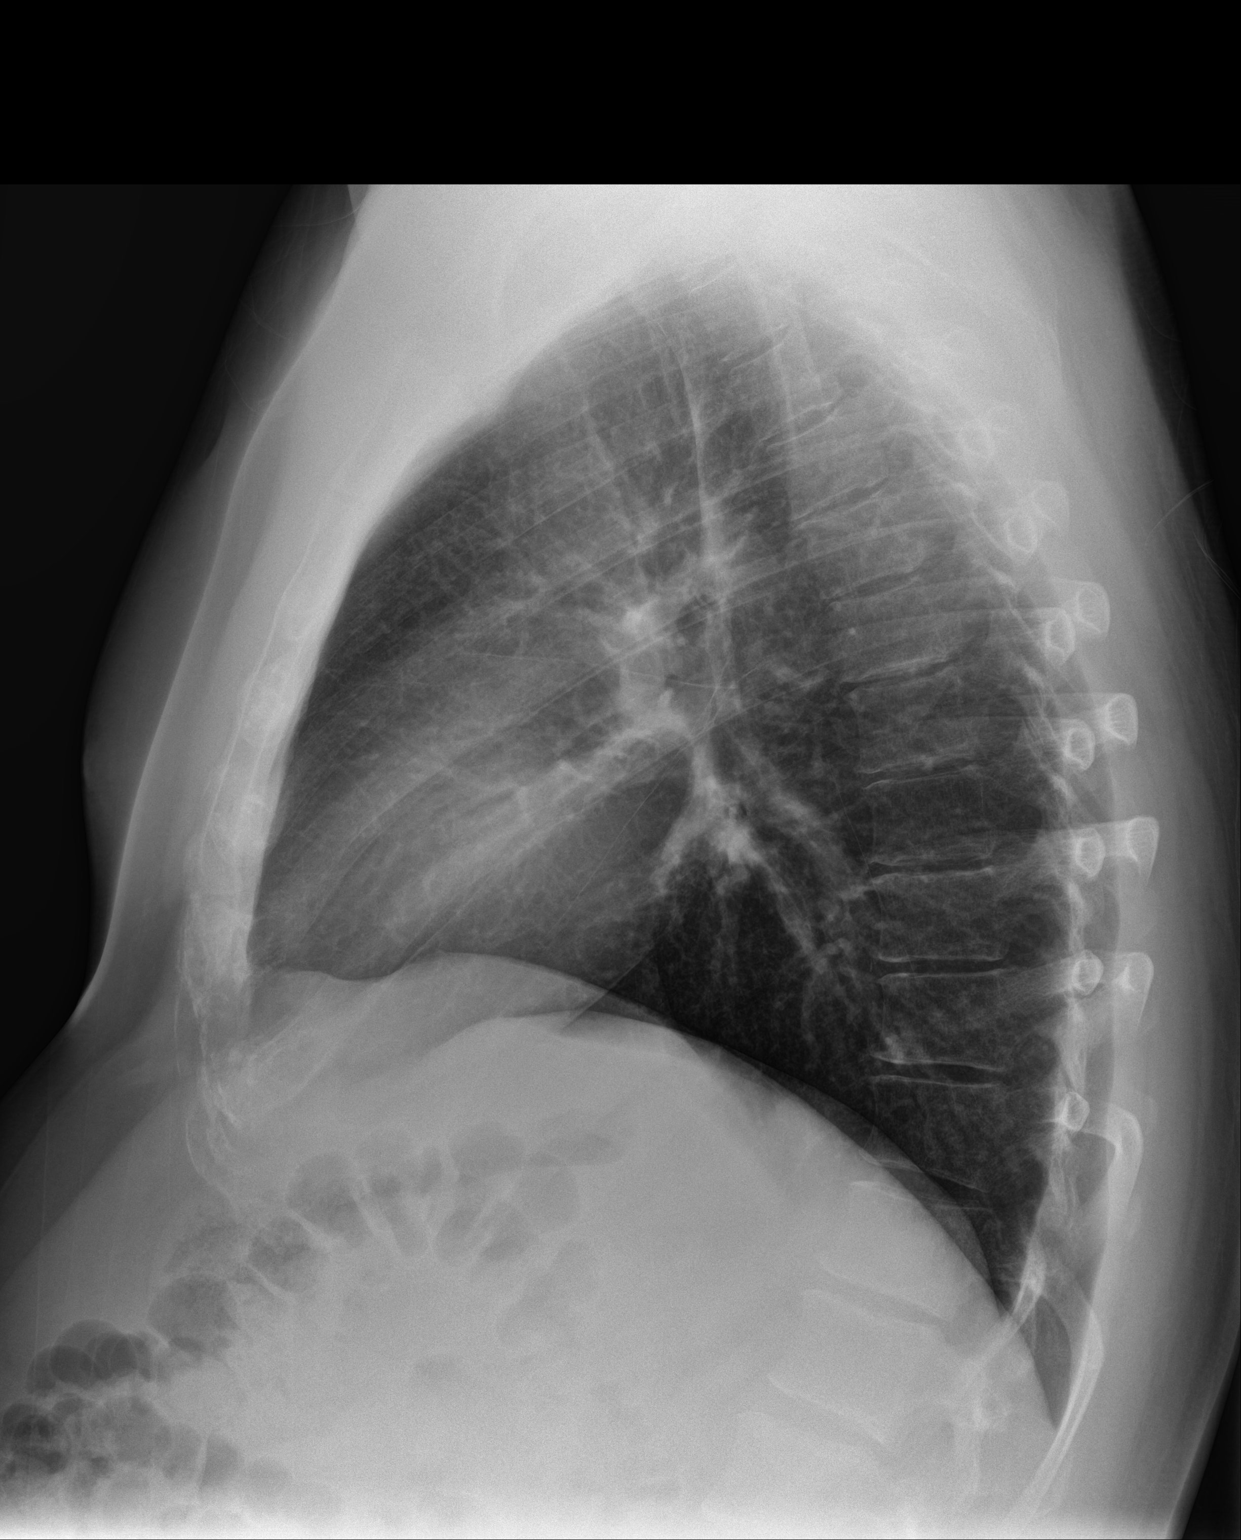

[2 of 2 positions shown; findings below may reference images not displayed]

FINDINGS: Cardiac silhouette and mediastinal contours are within normal
limits. The lungs are clear. No pleural effusion or pneumothorax.
Minimal multilevel degenerative disc changes of the thoracic spine.
IMPRESSION: No active cardiopulmonary disease.

## 2023-02-04 ENCOUNTER — Encounter: Payer: Self-pay | Admitting: Emergency Medicine

## 2023-02-04 ENCOUNTER — Ambulatory Visit
Admission: EM | Admit: 2023-02-04 | Discharge: 2023-02-04 | Disposition: A | Discharge status: Home / Self Care | Payer: BC Managed Care – PPO | Attending: Physician Assistant | Admitting: Physician Assistant

## 2023-02-04 DIAGNOSIS — Z1152 Encounter for screening for COVID-19: Secondary | ICD-10-CM | POA: Diagnosis not present

## 2023-02-04 DIAGNOSIS — R051 Acute cough: Secondary | ICD-10-CM | POA: Insufficient documentation

## 2023-02-04 DIAGNOSIS — J069 Acute upper respiratory infection, unspecified: Secondary | ICD-10-CM | POA: Diagnosis present

## 2023-02-04 DIAGNOSIS — J029 Acute pharyngitis, unspecified: Secondary | ICD-10-CM | POA: Diagnosis not present

## 2023-02-04 LAB — SARS CORONAVIRUS 2 BY RT PCR: SARS Coronavirus 2 by RT PCR: NEGATIVE

## 2023-02-04 LAB — GROUP A STREP BY PCR: Group A Strep by PCR: NOT DETECTED

## 2023-02-04 MED ORDER — PROMETHAZINE-DM 6.25-15 MG/5ML PO SYRP: 5.0000 mL | ORAL_SOLUTION | Freq: Four times a day (QID) | ORAL | 0 refills | Status: DC | PRN

## 2023-02-04 NOTE — ED Triage Notes (Signed)
Pt presents with a non-productive cough, bilateral ear fullness, chills, sore throat and bodyaches that started this morning.

## 2023-02-04 NOTE — ED Provider Notes (Signed)
[facility]    CSN: [id number] Arrival date & time: 02/04/23  0914      History   Chief Complaint Chief Complaint  Patient presents with   Chills   Cough   Otalgia   Generalized Body Aches    HPI Trevor Dickson is a 37 y.o. male presenting for onset of fatigue, headaches, cough, congestion, ear pressure/fullness, and sore throat since this morning.  He denies any recorded temperatures and has not complained of sinus pain, chest pain or breathing difficulty.  No sick contacts in the house but he does have multiple children.  He has not taken any OTC meds for symptoms.  He denies a history of lung disease.  He vapes every day.  History of bronchitis per chart review.  No other complaints.  HPI  History reviewed. No pertinent past medical history.  There are no problems to display for this patient.   Past Surgical History:  Procedure Laterality Date   APPENDECTOMY         Home Medications    Prior to Admission medications   Medication Sig Start Date End Date Taking? Authorizing Provider  promethazine-dextromethorphan (PROMETHAZINE-DM) 6.25-15 MG/5ML syrup Take 5 mLs by mouth 4 (four) times daily as needed. 02/04/23  Yes Shirlee Latch, PA-C  albuterol (VENTOLIN HFA) 108 (90 Base) MCG/ACT inhaler Inhale 1-2 puffs into the lungs every 6 (six) hours as needed for wheezing or shortness of breath. 06/20/22   Katha Cabal, DO  cetirizine (ZYRTEC) 10 MG tablet Take 10 mg by mouth daily.    [provider]  hyoscyamine (LEVSIN) 0.125 MG tablet Take by mouth. 01/26/22   [provider]  ketorolac (TORADOL) 10 MG tablet Take 1 tablet (10 mg total) by mouth every 6 (six) hours as needed. 02/17/22   Rodriguez-Southworth, Nettie Elm, PA-C  Multiple Vitamins-Minerals (GNP ONE DAILY MENS 50+ADVANCED) TABS Take 1 tablet by mouth daily.    [provider]  Naproxen Sodium 220 MG CAPS Take by mouth.    [provider]  traZODone (DESYREL) 50 MG  tablet Take 50 mg by mouth at bedtime. 01/26/22   [provider]    Family History History reviewed. No pertinent family history.  Social History Social History   Tobacco Use   Smoking status: Former    Types: Cigarettes   Smokeless tobacco: Never  Vaping Use   Vaping Use: Every day  Substance Use Topics   Alcohol use: Yes   Drug use: Never     Allergies   Ibuprofen   Review of Systems Review of Systems  Constitutional:  Positive for fatigue. Negative for diaphoresis and fever.  HENT:  Positive for congestion, rhinorrhea and sore throat. Negative for ear pain and sinus pain.   Respiratory:  Positive for cough. Negative for chest tightness and shortness of breath.   Cardiovascular:  Negative for chest pain.  Gastrointestinal:  Negative for abdominal pain, diarrhea, nausea and vomiting.  Musculoskeletal:  Negative for myalgias.  Neurological:  Positive for headaches. Negative for dizziness and weakness.     Physical Exam Triage Vital Signs ED Triage Vitals  Enc Vitals Group     BP      Pulse      Resp      Temp      Temp src      SpO2      Weight      Height      Head Circumference      Peak Flow  Pain Score      Pain Loc      Pain Edu?      Excl. in GC?    No data found.  Updated Vital Signs BP 114/75 (BP Location: Left Arm)   Pulse 64   Temp 98.4 F (36.9 C) (Oral)   Resp 16   SpO2 98%      Physical Exam Vitals and nursing note reviewed.  Constitutional:      General: He is not in acute distress.    Appearance: Normal appearance. He is well-developed. He is ill-appearing.     Comments: Coughs forcefully frequently  HENT:     Head: Normocephalic and atraumatic.     Right Ear: Tympanic membrane, ear canal and external ear normal.     Left Ear: Tympanic membrane, ear canal and external ear normal.     Nose: Congestion present.     Mouth/Throat:     Mouth: Mucous membranes are moist.     Pharynx: Oropharynx is clear. Posterior  oropharyngeal erythema present.  Eyes:     General: No scleral icterus.    Conjunctiva/sclera: Conjunctivae normal.  Cardiovascular:     Rate and Rhythm: Normal rate and regular rhythm.     Heart sounds: Normal heart sounds.  Pulmonary:     Effort: Pulmonary effort is normal. No respiratory distress.     Breath sounds: Normal breath sounds. No wheezing, rhonchi or rales.  Musculoskeletal:     Cervical back: Neck supple.  Skin:    General: Skin is warm and dry.     Capillary Refill: Capillary refill takes less than 2 seconds.  Neurological:     General: No focal deficit present.     Mental Status: He is alert. Mental status is at baseline.     Motor: No weakness.     Gait: Gait normal.  Psychiatric:        Mood and Affect: Mood normal.        Behavior: Behavior normal.      UC Treatments / Results  Labs (all labs ordered are listed, but only abnormal results are displayed) Labs Reviewed  SARS CORONAVIRUS 2 BY RT PCR  GROUP A STREP BY PCR    EKG   Radiology No results found.  Procedures Procedures (including critical care time)  Medications Ordered in UC Medications - No data to display  Initial Impression / Assessment and Plan / UC Course  I have reviewed the triage vital signs and the nursing notes.  Pertinent labs & imaging results that were available during my care of the patient were reviewed by me and considered in my medical decision making (see chart for details).    37 y/o male presenting for cough, congestion, sore throat, fatigue since this morning.  No sick contacts.  BP normal at 114/75.  He is afebrile.  Oxygen 99% and he is in no distress.  Appears mildly ill and fatigued but nontoxic.  On exam has nasal congestion and mild erythema posterior pharynx.  Chest clear to auscultation in all lung fields. Heart RRR.  PCR strep test obtained.  COVID testing also obtained.  Negative strep and COVID.  Results communicated to patient.  Patient's symptoms  still consistent with viral illness.  Advised him to try the Promethazine DM.  Plenty rest and fluids.  Advised him to return if he develops a fever, worsening cough or increased shortness of breath.  Final Clinical Impressions(s) / UC Diagnoses   Final diagnoses:  Viral upper  respiratory tract infection  Acute cough  Sore throat     Discharge Instructions      -Strep negative. - Will call if the COVID test is positive.  If positive you will need to isolate until you are fever free and feeling better.  If you not hear from me the COVID test is negative and you have another virus.  Treat symptoms with medication as prescribed or OTC meds.  URI/COLD SYMPTOMS: Your exam today is consistent with a viral illness. Antibiotics are not indicated at this time. Use medications as directed, including cough syrup, nasal saline, and decongestants. Your symptoms should improve over the next few days and resolve within 7-10 days. Increase rest and fluids. F/u if symptoms worsen or predominate such as sore throat, ear pain, productive cough, shortness of breath, or if you develop high fevers or worsening fatigue over the next several days.           ED Prescriptions     Medication Sig Dispense Auth. Provider   promethazine-dextromethorphan (PROMETHAZINE-DM) 6.25-15 MG/5ML syrup Take 5 mLs by mouth 4 (four) times daily as needed. 118 mL Shirlee Latch, PA-C      PDMP not reviewed this encounter.       Shirlee Latch, PA-C 02/04/23 1220

## 2023-02-04 NOTE — Discharge Instructions (Signed)
-  Strep negative. - Will call if the COVID test is positive.  If positive you will need to isolate until you are fever free and feeling better.  If you not hear from me the COVID test is negative and you have another virus.  Treat symptoms with medication as prescribed or OTC meds.  URI/COLD SYMPTOMS: Your exam today is consistent with a viral illness. Antibiotics are not indicated at this time. Use medications as directed, including cough syrup, nasal saline, and decongestants. Your symptoms should improve over the next few days and resolve within 7-10 days. Increase rest and fluids. F/u if symptoms worsen or predominate such as sore throat, ear pain, productive cough, shortness of breath, or if you develop high fevers or worsening fatigue over the next several days.

## 2023-05-04 ENCOUNTER — Ambulatory Visit
Admission: EM | Admit: 2023-05-04 | Discharge: 2023-05-04 | Disposition: A | Discharge status: Home / Self Care | Payer: BC Managed Care – PPO

## 2023-05-04 ENCOUNTER — Encounter: Payer: Self-pay | Admitting: *Deleted

## 2023-05-04 DIAGNOSIS — J208 Acute bronchitis due to other specified organisms: Secondary | ICD-10-CM | POA: Diagnosis not present

## 2023-05-04 HISTORY — DX: Attention-deficit hyperactivity disorder, unspecified type: F90.9

## 2023-05-04 HISTORY — DX: Irritable bowel syndrome, unspecified: K58.9

## 2023-05-04 HISTORY — DX: Other chronic pain: G89.29

## 2023-05-04 MED ORDER — AZITHROMYCIN 250 MG PO TABS: 250.0000 mg | ORAL_TABLET | Freq: Every day | ORAL | 0 refills | Status: AC

## 2023-05-04 MED ORDER — PROMETHAZINE-DM 6.25-15 MG/5ML PO SYRP: 5.0000 mL | ORAL_SOLUTION | Freq: Four times a day (QID) | ORAL | 0 refills | Status: AC | PRN
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
## 2023-05-04 NOTE — Discharge Instructions (Addendum)
Start Azithromycin take 2 tablets today and then daily until finished Promethazine-DM 5 mL 4 times daily as needed for cough. Use caution with driving Rest and Hydrate Return to urgent care if symptoms worsen or fail to resolve.

## 2023-07-02 ENCOUNTER — Ambulatory Visit
Admission: EM | Admit: 2023-07-02 | Discharge: 2023-07-02 | Disposition: A | Discharge status: Home / Self Care | Payer: BC Managed Care – PPO

## 2023-07-02 DIAGNOSIS — J302 Other seasonal allergic rhinitis: Secondary | ICD-10-CM | POA: Diagnosis not present

## 2023-07-02 DIAGNOSIS — B349 Viral infection, unspecified: Secondary | ICD-10-CM

## 2023-07-02 NOTE — Discharge Instructions (Addendum)
Most likely you have a viral illness: no antibiotic as indicated at this time, May treat with OTC meds of choice. Make sure to drink plenty of fluids to stay hydrated(gatorade, water, popsicles,jello,etc), avoid caffeine products. Follow up with PCP. Return as needed.

## 2023-07-02 NOTE — ED Provider Notes (Signed)
[facility]    CSN: [id number] Arrival date & time: 07/02/23  1426      History   Chief Complaint Chief Complaint  Patient presents with   Nasal Congestion    HPI Trevor Dickson is a 37 y.o. male.   Trevor Dickson , 37 year old male patient , presents to urgent care for evaluation of nasal congestion, scratchy throat,hA x 2 days.  Is here with her son who has similar symptoms.   The history is provided by the patient. No language interpreter was used.    Past Medical History:  Diagnosis Date   ADHD    Chronic back pain    IBS (irritable bowel syndrome)     Patient Active Problem List   Diagnosis Date Noted   Nonspecific syndrome suggestive of viral illness 07/02/2023   Seasonal allergies 07/02/2023    Past Surgical History:  Procedure Laterality Date   APPENDECTOMY         Home Medications    Prior to Admission medications   Medication Sig Start Date End Date Taking? Authorizing Provider  metoprolol succinate (TOPROL-XL) 50 MG 24 hr tablet Take 75 mg by mouth daily.   Yes [provider]  traZODone (DESYREL) 50 MG tablet Take 50 mg by mouth at bedtime. 01/26/22  Yes [provider]  albuterol (VENTOLIN HFA) 108 (90 Base) MCG/ACT inhaler Inhale 1-2 puffs into the lungs every 6 (six) hours as needed for wheezing or shortness of breath. 06/20/22   Katha Cabal, DO  amphetamine-dextroamphetamine (ADDERALL XR) 10 MG 24 hr capsule Take 10 mg by mouth every morning. 03/31/23   [provider]  azithromycin (ZITHROMAX) 250 MG tablet Take 1 tablet (250 mg total) by mouth daily. Take first 2 tablets together, then 1 every day until finished. 05/04/23   White, Elizabeth A, PA-C  busPIRone (BUSPAR) 10 MG tablet Take by mouth. 12/04/22   [provider]  cetirizine (ZYRTEC) 10 MG tablet Take 10 mg by mouth daily.    [provider]  hyoscyamine (LEVSIN) 0.125 MG tablet Take by mouth. 01/26/22   [provider]   ketorolac (TORADOL) 10 MG tablet Take 1 tablet (10 mg total) by mouth every 6 (six) hours as needed. 02/17/22   Rodriguez-Southworth, Nettie Elm, PA-C  Multiple Vitamins-Minerals (GNP ONE DAILY MENS 50+ADVANCED) TABS Take 1 tablet by mouth daily.    [provider]  Naproxen Sodium 220 MG CAPS Take by mouth.    [provider]  promethazine-dextromethorphan (PROMETHAZINE-DM) 6.25-15 MG/5ML syrup Take 5 mLs by mouth 4 (four) times daily as needed for cough. 05/04/23   Landis Martins, PA-C    Family History History reviewed. No pertinent family history.  Social History Social History   Tobacco Use   Smoking status: Former    Current packs/day: 0.00    Types: Cigarettes    Quit date: 2022    Years since quitting: 2.9   Smokeless tobacco: Never  Vaping Use   Vaping status: Every Day  Substance Use Topics   Alcohol use: Not Currently   Drug use: Never     Allergies   Ibuprofen   Review of Systems Review of Systems  Constitutional:  Negative for fever.  HENT:  Positive for congestion and sore throat.   Respiratory:  Negative for cough.   Cardiovascular:  Negative for chest pain.  Gastrointestinal:  Negative for abdominal pain.  Neurological:  Positive for headaches.  All other systems reviewed and are negative.    Physical  Exam Triage Vital Signs ED Triage Vitals  Encounter Vitals Group     BP 07/02/23 1448 (!) 134/95     Systolic BP Percentile --      Diastolic BP Percentile --      Pulse Rate 07/02/23 1448 63     Resp 07/02/23 1448 19     Temp 07/02/23 1448 98.6 F (37 C)     Temp Source 07/02/23 1448 Oral     SpO2 07/02/23 1448 96 %     Weight --      Height --      Head Circumference --      Peak Flow --      Pain Score 07/02/23 1447 0     Pain Loc --      Pain Education --      Exclude from Growth Chart --    No data found.  Updated Vital Signs BP (!) 134/95 (BP Location: Right Arm)   Pulse 63   Temp 98.6 F (37 C) (Oral)   Resp  19   SpO2 96%   Visual Acuity Right Eye Distance:   Left Eye Distance:   Bilateral Distance:    Right Eye Near:   Left Eye Near:    Bilateral Near:     Physical Exam Vitals and nursing note reviewed.  Constitutional:      General: He is not in acute distress.    Appearance: He is well-developed and well-groomed.  HENT:     Head: Normocephalic and atraumatic.     Right Ear: Tympanic membrane is retracted.     Left Ear: Tympanic membrane is retracted.     Nose: Mucosal edema and congestion present.     Right Sinus: No maxillary sinus tenderness or frontal sinus tenderness.     Left Sinus: No maxillary sinus tenderness or frontal sinus tenderness.  Eyes:     General: Lids are normal.     Extraocular Movements: Extraocular movements intact.     Conjunctiva/sclera: Conjunctivae normal.     Pupils: Pupils are equal, round, and reactive to light.  Cardiovascular:     Rate and Rhythm: Normal rate and regular rhythm.     Pulses: Normal pulses.     Heart sounds: Normal heart sounds. No murmur heard. Pulmonary:     Effort: Pulmonary effort is normal. No respiratory distress.     Breath sounds: Normal breath sounds and air entry.  Abdominal:     Palpations: Abdomen is soft.     Tenderness: There is no abdominal tenderness.  Musculoskeletal:        General: No swelling.     Cervical back: Neck supple.  Skin:    General: Skin is warm and dry.     Capillary Refill: Capillary refill takes less than 2 seconds.  Neurological:     General: No focal deficit present.     Mental Status: He is alert and oriented to person, place, and time.     GCS: GCS eye subscore is 4. GCS verbal subscore is 5. GCS motor subscore is 6.     Cranial Nerves: No cranial nerve deficit.     Sensory: No sensory deficit.  Psychiatric:        Attention and Perception: Attention normal.        Mood and Affect: Mood normal.        Speech: Speech normal.        Behavior: Behavior normal. Behavior is  cooperative.  UC Treatments / Results  Labs (all labs ordered are listed, but only abnormal results are displayed) Labs Reviewed - No data to display  EKG   Radiology No results found.  Procedures Procedures (including critical care time)  Medications Ordered in UC Medications - No data to display  Initial Impression / Assessment and Plan / UC Course  I have reviewed the triage vital signs and the nursing notes.  Pertinent labs & imaging results that were available during my care of the patient were reviewed by me and considered in my medical decision making (see chart for details).    Discussed exam findings and plan of care with patient, strict go to ER precautions given.   Patient verbalized understanding to this provider.  Ddx: Viral illness, seasonal allergies Final Clinical Impressions(s) / UC Diagnoses   Final diagnoses:  Nonspecific syndrome suggestive of viral illness  Seasonal allergies     Discharge Instructions      Most likely you have a viral illness: no antibiotic as indicated at this time, May treat with OTC meds of choice. Make sure to drink plenty of fluids to stay hydrated(gatorade, water, popsicles,jello,etc), avoid caffeine products. Follow up with PCP. Return as needed.    ED Prescriptions   None    PDMP not reviewed this encounter.   Clancy Gourd, NP 07/02/23 1657

## 2023-07-02 NOTE — ED Triage Notes (Signed)
Nasal congestion-scratchy throat- headache. X 2 days
